# Patient Record
Sex: Male | Born: 2000
Health system: Southern US, Community
[De-identification: ages and names within clinical notes are randomized; demographics above are authoritative.]

## PROBLEM LIST (undated history)

## (undated) DIAGNOSIS — J45909 Unspecified asthma, uncomplicated: Secondary | ICD-10-CM

---
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
## 2021-04-26 ENCOUNTER — Encounter (HOSPITAL_COMMUNITY): Payer: Self-pay

## 2021-04-26 ENCOUNTER — Emergency Department (HOSPITAL_COMMUNITY): Payer: 59

## 2021-04-26 ENCOUNTER — Emergency Department (HOSPITAL_COMMUNITY)
Admission: EM | Admit: 2021-04-26 | Discharge: 2021-04-26 | Disposition: A | Discharge status: Home / Self Care | Payer: 59 | Attending: Emergency Medicine | Admitting: Emergency Medicine

## 2021-04-26 DIAGNOSIS — F17210 Nicotine dependence, cigarettes, uncomplicated: Secondary | ICD-10-CM | POA: Insufficient documentation

## 2021-04-26 DIAGNOSIS — S161XXA Strain of muscle, fascia and tendon at neck level, initial encounter: Secondary | ICD-10-CM

## 2021-04-26 DIAGNOSIS — I10 Essential (primary) hypertension: Secondary | ICD-10-CM | POA: Diagnosis not present

## 2021-04-26 DIAGNOSIS — J45909 Unspecified asthma, uncomplicated: Secondary | ICD-10-CM | POA: Diagnosis not present

## 2021-04-26 DIAGNOSIS — R42 Dizziness and giddiness: Secondary | ICD-10-CM | POA: Insufficient documentation

## 2021-04-26 DIAGNOSIS — F0781 Postconcussional syndrome: Secondary | ICD-10-CM

## 2021-04-26 DIAGNOSIS — W01198A Fall on same level from slipping, tripping and stumbling with subsequent striking against other object, initial encounter: Secondary | ICD-10-CM | POA: Diagnosis not present

## 2021-04-26 DIAGNOSIS — Y990 Civilian activity done for income or pay: Secondary | ICD-10-CM | POA: Insufficient documentation

## 2021-04-26 DIAGNOSIS — S199XXA Unspecified injury of neck, initial encounter: Secondary | ICD-10-CM | POA: Diagnosis present

## 2021-04-26 DIAGNOSIS — W19XXXA Unspecified fall, initial encounter: Secondary | ICD-10-CM

## 2021-04-26 NOTE — Discharge Instructions (Addendum)
I recommend a combination of tylenol and ibuprofen for management of your pain. You can take a low dose of both at the same time. I recommend 500 mg of Tylenol combined with 600 mg of ibuprofen. This is one maximum strength Tylenol and three regular ibuprofen. You can take these 2-3 times for day for your pain. Please try to take these medications with a small amount of food as well to prevent upsetting your stomach.  I have attached information on postconcussion syndrome.  Please reference this with any questions.  If you develop any new or worsening symptoms such as numbness, weakness, visual changes, nausea/vomiting you cannot control, worsening headaches, please come back to the emergency department for reevaluation.  It was a pleasure to meet you.

## 2021-04-26 NOTE — ED Triage Notes (Signed)
Pt arrived via walk in, states slip and fall at work. No LOC, no blood thinners. Vomited x1 after fall. No visual disturbances. Endorses headache, but no other issue.

## 2021-04-26 NOTE — ED Provider Notes (Addendum)
Emergency Medicine Provider Triage Evaluation Note  Edgar Crane , a 20 y.o. male  was evaluated in triage.  Pt complains of head injury. Slipped and fell at work, hit back of head on ground around 12 pm. Felt dizzy and unsteady on his feet afterward. Vomited once around 2 pm. Continues to feel nauseous. Denies anticoagulation.  Review of Systems  Positive: Head injury, neck pain, vomiting, nausea Negative: LOC, CP, SOB, neuro deficits  Physical Exam  BP (!) 158/89 (BP Location: Right Arm)   Pulse 87   Temp 98.5 F (36.9 C) (Oral)   Resp 16   SpO2 99%  Gen:   Awake, no distress   Resp:  Normal effort  MSK:   Moves extremities without difficulty, midline cervical spinal tenderness  Other:  Tenderness to occipital region  Medical Decision Making  Medically screening exam initiated at 2:46 PM.  Appropriate orders placed.  Edgar Crane was informed that the remainder of the evaluation will be completed by another provider, this initial triage assessment does not replace that evaluation, and the importance of remaining in the ED until their evaluation is complete.     Anselm Pancoast, PA-C 04/26/21 1449    Anselm Pancoast, PA-C 04/26/21 1450    Cathren Laine, MD 04/27/21 1700

## 2021-04-26 NOTE — ED Provider Notes (Signed)
Edgar Crane Provider Note   CSN: [id number] Arrival date & time: 04/26/21  1406     History Chief Complaint  Patient presents with   [name]    Edgar Crane is a 20 y.o. male.  HPI Patient is a 20 year old male with a medical history as noted below.  He presents to the emergency department due to a fall that occurred this morning at work.  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  He states that initially was having mild pain in the region and then went home and began feeling lightheaded and had an episode of nausea/vomiting.  He has mild nausea upon arrival to the emergency department which has since resolved.  He also states that his headache has resolved.  He reports mild neck pain.  Not anticoagulated.  No numbness, weakness, visual changes, chest pain, shortness of breath.    Past Medical History:  Diagnosis Date   Asthma     Patient Active Problem List   Diagnosis Date Noted   Morbid obesity (HCC) 01/07/2015   Acanthosis nigricans, acquired 01/07/2015   Dyspepsia 01/07/2015   Essential hypertension, benign 01/07/2015   Goiter 01/07/2015   Gynecomastia, male 01/07/2015    History reviewed. No pertinent surgical history.     Family History  Problem Relation Age of Onset   Diabetes Mother    Hyperlipidemia Mother    Stroke Maternal Grandfather     Social History   Tobacco Use   Smoking status: Some Days    Pack years: 0.00    Types: Cigarettes   Smokeless tobacco: Current    Home Medications Prior to Admission medications   Medication Sig Start Date End Date Taking? Authorizing Provider  atomoxetine (STRATTERA) 25 MG capsule Take 25 mg by mouth daily.      [provider]  ibuprofen (ADVIL,MOTRIN) 600 MG tablet Take 1 tablet (600 mg total) by mouth every 8 (eight) hours as needed. 03/31/18   Mardella Layman, MD  methylphenidate 36 MG  PO CR tablet Take 36 mg by mouth daily.    [provider]    Allergies    Patient has no known allergies.  Review of Systems   Review of Systems  Eyes:  Negative for visual disturbance.  Gastrointestinal:  Positive for nausea. Negative for vomiting.  Musculoskeletal:  Positive for myalgias and neck pain. Negative for back pain.  Skin:  Negative for wound.  Neurological:  Positive for headaches. Negative for syncope, weakness and numbness.   Physical Exam Updated Vital Signs BP (!) 148/91 (BP Location: Right Arm)   Pulse 78   Temp 98.5 F (36.9 C) (Oral)   Resp 14   SpO2 99%   Physical Exam Vitals and nursing note reviewed.  Constitutional:      General: He is not in acute distress.    Appearance: Normal appearance. He is normal weight. He is not ill-appearing, toxic-appearing or diaphoretic.  HENT:     Head: Normocephalic.     Comments: Mild tenderness noted along the occiput.  No crepitus.    Right Ear: External ear normal.     Left Ear: External ear normal.     Nose: Nose normal.     Mouth/Throat:     Mouth: Mucous membranes are moist.     Pharynx: Oropharynx is clear. No oropharyngeal exudate or posterior oropharyngeal erythema.  Eyes:  General: No scleral icterus.       Right eye: No discharge.        Left eye: No discharge.     Extraocular Movements: Extraocular movements intact.     Conjunctiva/sclera: Conjunctivae normal.     Pupils: Pupils are equal, round, and reactive to light.  Neck:     Comments: Mild diffuse tenderness noted along the cervical spine that appears to be worst along the bilateral cervical paraspinal musculature. Cardiovascular:     Rate and Rhythm: Normal rate and regular rhythm.     Pulses: Normal pulses.     Heart sounds: Normal heart sounds. No murmur heard.   No friction rub. No gallop.  Pulmonary:     Effort: Pulmonary effort is normal. No respiratory distress.     Breath sounds: Normal breath sounds. No stridor. No  wheezing, rhonchi or rales.  Abdominal:     General: Abdomen is flat.     Palpations: Abdomen is soft.     Tenderness: There is no abdominal tenderness.  Musculoskeletal:        General: Normal range of motion.     Cervical back: Normal range of motion and neck supple. Tenderness present.     Right lower leg: No edema.     Left lower leg: No edema.  Skin:    General: Skin is warm and dry.  Neurological:     General: No focal deficit present.     Mental Status: He is alert and oriented to person, place, and time.     Comments: Patient is oriented to person, place, and time. Patient phonates in clear, complete, and coherent sentences. Negative arm drift. Strength is 5/5 in all four extremities. Distal sensation intact in all four extremities. Ambulatory with a steady gait.    Psychiatric:        Mood and Affect: Mood normal.        Behavior: Behavior normal.    ED Results / Procedures / Treatments   Labs (all labs ordered are listed, but only abnormal results are displayed) Labs Reviewed - No data to display  EKG None  Radiology CT Head Wo Contrast  Result Date: 04/26/2021 CLINICAL DATA:  Neck trauma, status post fall EXAM: CT HEAD WITHOUT CONTRAST CT CERVICAL SPINE WITHOUT CONTRAST TECHNIQUE: Multidetector CT imaging of the head and cervical spine was performed following the standard protocol without intravenous contrast. Multiplanar CT image reconstructions of the cervical spine were also generated. COMPARISON:  None. FINDINGS: CT HEAD FINDINGS Brain: No evidence of acute infarction, hemorrhage, hydrocephalus, extra-axial collection or mass lesion/mass effect. Vascular: No hyperdense vessel or unexpected calcification. Skull: No osseous abnormality. Sinuses/Orbits: Visualized paranasal sinuses are clear. Visualized mastoid sinuses are clear. Visualized orbits demonstrate no focal abnormality. Other: No fluid collection or hematoma. CT CERVICAL SPINE FINDINGS Alignment: Normal. Skull  base and vertebrae: No acute fracture. No primary bone lesion or focal pathologic process. Soft tissues and spinal canal: No prevertebral fluid or swelling. No visible canal hematoma. Disc levels:  Disc spaces are maintained. No foraminal narrowing. Upper chest: Lung apices are clear. Other: No fluid collection or hematoma. IMPRESSION: 1. No acute intracranial pathology. 2. No acute osseous injury the cervical spine. Electronically Signed   By: Elige Ko   On: 04/26/2021 16:18   CT Cervical Spine Wo Contrast  Result Date: 04/26/2021 CLINICAL DATA:  Neck trauma, status post fall EXAM: CT HEAD WITHOUT CONTRAST CT CERVICAL SPINE WITHOUT CONTRAST TECHNIQUE: Multidetector CT imaging of the head and  cervical spine was performed following the standard protocol without intravenous contrast. Multiplanar CT image reconstructions of the cervical spine were also generated. COMPARISON:  None. FINDINGS: CT HEAD FINDINGS Brain: No evidence of acute infarction, hemorrhage, hydrocephalus, extra-axial collection or mass lesion/mass effect. Vascular: No hyperdense vessel or unexpected calcification. Skull: No osseous abnormality. Sinuses/Orbits: Visualized paranasal sinuses are clear. Visualized mastoid sinuses are clear. Visualized orbits demonstrate no focal abnormality. Other: No fluid collection or hematoma. CT CERVICAL SPINE FINDINGS Alignment: Normal. Skull base and vertebrae: No acute fracture. No primary bone lesion or focal pathologic process. Soft tissues and spinal canal: No prevertebral fluid or swelling. No visible canal hematoma. Disc levels:  Disc spaces are maintained. No foraminal narrowing. Upper chest: Lung apices are clear. Other: No fluid collection or hematoma. IMPRESSION: 1. No acute intracranial pathology. 2. No acute osseous injury the cervical spine. Electronically Signed   By: Elige Ko   On: 04/26/2021 16:18    Procedures Procedures   Medications Ordered in ED Medications - No data to  display  ED Course  I have reviewed the triage vital signs and the nursing notes.  Pertinent labs & imaging results that were available during my care of the patient were reviewed by me and considered in my medical decision making (see chart for details).    MDM Rules/Calculators/A&P                          Patient is a 20 year old male who presents to the emergency department with symptoms consistent with postconcussive syndrome.  Physical exam is extremely reassuring.  He has mild tenderness along the cervical spinal musculature as well as mild tenderness along the occiput but otherwise no complaints.  No midline thoracic or lumbar spine pain.  Neurological exam is benign.  Moving all 4 extremities with ease.  No gross deficits.  Ambulatory with a steady gait.  States his symptoms have mostly resolved since arriving to the emergency department.  Feel the patient is stable for discharge at this time and he is agreeable.  Urged him to continue monitoring his symptoms closely.  Return to the emergency department if he develops any new or worsening symptoms.  We discussed pain management with Tylenol and ibuprofen.  We discussed dosing.  His questions were answered and he was amicable at the time of discharge.  Final Clinical Impression(s) / ED Diagnoses Final diagnoses:  Fall, initial encounter  Strain of neck muscle, initial encounter  Postconcussive syndrome   Rx / DC Orders ED Discharge Orders     None        Placido Sou, PA-C 04/26/21 2131    Cathren Laine, MD 04/27/21 1700

## 2021-07-25 ENCOUNTER — Encounter (HOSPITAL_COMMUNITY): Payer: Self-pay

## 2021-08-23 ENCOUNTER — Encounter (HOSPITAL_COMMUNITY): Payer: Self-pay | Admitting: *Deleted

## 2021-08-23 ENCOUNTER — Emergency Department (HOSPITAL_COMMUNITY)
Admission: EM | Admit: 2021-08-23 | Discharge: 2021-08-23 | Disposition: A | Discharge status: Home / Self Care | Payer: 59 | Attending: Emergency Medicine | Admitting: Emergency Medicine

## 2021-08-23 ENCOUNTER — Encounter (INDEPENDENT_AMBULATORY_CARE_PROVIDER_SITE_OTHER): Payer: Self-pay

## 2021-08-23 ENCOUNTER — Other Ambulatory Visit: Payer: Self-pay

## 2021-08-23 DIAGNOSIS — F17210 Nicotine dependence, cigarettes, uncomplicated: Secondary | ICD-10-CM | POA: Insufficient documentation

## 2021-08-23 DIAGNOSIS — J45909 Unspecified asthma, uncomplicated: Secondary | ICD-10-CM | POA: Insufficient documentation

## 2021-08-23 DIAGNOSIS — R55 Syncope and collapse: Secondary | ICD-10-CM | POA: Insufficient documentation

## 2021-08-23 DIAGNOSIS — I10 Essential (primary) hypertension: Secondary | ICD-10-CM | POA: Insufficient documentation

## 2021-08-23 DIAGNOSIS — R42 Dizziness and giddiness: Secondary | ICD-10-CM | POA: Diagnosis present

## 2021-08-23 LAB — COMPREHENSIVE METABOLIC PANEL
ALT: 23 U/L (ref 0–44)
AST: 20 U/L (ref 15–41)
Albumin: 3.7 g/dL (ref 3.5–5.0)
Alkaline Phosphatase: 63 U/L (ref 38–126)
Anion gap: 9 (ref 5–15)
BUN: 11 mg/dL (ref 6–20)
CO2: 28 mmol/L (ref 22–32)
Calcium: 9.4 mg/dL (ref 8.9–10.3)
Chloride: 99 mmol/L (ref 98–111)
Creatinine, Ser: 0.95 mg/dL (ref 0.61–1.24)
GFR, Estimated: >60 mL/min (ref >60)
Glucose, Bld: 104 mg/dL — ABNORMAL HIGH (ref 70–99)
Potassium: 4 mmol/L (ref 3.5–5.1)
Sodium: 136 mmol/L (ref 135–145)
Total Bilirubin: 0.9 mg/dL (ref 0.3–1.2)
Total Protein: 7.1 g/dL (ref 6.5–8.1)

## 2021-08-23 LAB — CBC
HCT: 43.7 % (ref 39.0–52.0)
Hemoglobin: 15.6 g/dL (ref 13.0–17.0)
MCH: 33.9 pg (ref 26.0–34.0)
MCHC: 35.7 g/dL (ref 30.0–36.0)
MCV: 95 fL (ref 80.0–100.0)
Platelets: 226 10*3/uL (ref 150–400)
RBC: 4.6 MIL/uL (ref 4.22–5.81)
RDW: 12.1 % (ref 11.5–15.5)
WBC: 8.4 10*3/uL (ref 4.0–10.5)
nRBC: 0 % (ref 0.0–0.2)

## 2021-08-23 LAB — URINALYSIS, ROUTINE W REFLEX MICROSCOPIC
Bilirubin Urine: NEGATIVE
Glucose, UA: NEGATIVE mg/dL
Hgb urine dipstick: NEGATIVE
Ketones, ur: NEGATIVE mg/dL
Leukocytes,Ua: NEGATIVE
Nitrite: NEGATIVE
Protein, ur: NEGATIVE mg/dL
Specific Gravity, Urine: 1.019 (ref 1.005–1.030)
pH: 5 (ref 5.0–8.0)

## 2021-08-23 NOTE — ED Provider Notes (Signed)
I saw and evaluated the patient, reviewed the resident's note and I agree with the findings and plan.  EKG Interpretation  Date/Time:  Wednesday August 23 2021 04:08:17 EDT Ventricular Rate:  85 PR Interval:  168 QRS Duration: 92 QT Interval:  352 QTC Calculation: 418 R Axis:   66 Text Interpretation: Normal sinus rhythm with sinus arrhythmia Normal ECG Confirmed by Lorre Nick ([id number]) on 08/23/2021 11:86:65 AM   20 year old male who presents with near syncope after urinating.  Patient's work-up here has been negative.  Has a suprapubic tenderness.  Urinalysis negative for that.  EKG without any changes.  Plan will be to discharge   Lorre Nick, MD 08/23/21 1206

## 2021-08-23 NOTE — ED Provider Notes (Signed)
[facility] EMERGENCY DEPARTMENT Provider Note   CSN: [id number] Arrival date & time: 08/23/21  3154     History Chief Complaint  Patient presents with   Dizziness    Edgar Crane is a 20 y.o. male who presents after experiencing an episode of presyncope overnight.  Patient states that he had gotten up to urinate in the night and as he was nearing the end of his urination he felt and as if things were swaying.  He is unable to quantify the amount of time that he was up before the onset of this episode.  He did not have a fainting episode.  He says that he has not had any episode like this previously.  He reports that he has been adequately eating and drinking.  He does report some suprapubic discomfort.  Mom who was in the room states that he recently moved out of his parents home and she states that he has had increased anxiety in this time.    Past Medical History:  Diagnosis Date   Asthma     Patient Active Problem List   Diagnosis Date Noted   Morbid obesity (HCC) 01/07/2015   Acanthosis nigricans, acquired 01/07/2015   Dyspepsia 01/07/2015   Essential hypertension, benign 01/07/2015   Goiter 01/07/2015   Gynecomastia, male 01/07/2015    History reviewed. No pertinent surgical history.     Family History  Problem Relation Age of Onset   Diabetes Mother    Hyperlipidemia Mother    Stroke Maternal Grandfather     Social History   Tobacco Use   Smoking status: Some Days    Types: Cigarettes   Smokeless tobacco: Current    Home Medications Prior to Admission medications   Medication Sig Start Date End Date Taking? Authorizing Provider  atomoxetine (STRATTERA) 25 MG capsule Take 25 mg by mouth daily.      [provider]  ibuprofen (ADVIL,MOTRIN) 600 MG tablet Take 1 tablet (600 mg total) by mouth every 8 (eight) hours as needed. 03/31/18   Mardella Layman, MD  methylphenidate 36 MG PO CR tablet Take 36 mg by mouth daily.     [provider]    Allergies    Patient has no known allergies.  Review of Systems   Review of Systems  Constitutional:  Negative for appetite change.  Respiratory:  Negative for chest tightness and shortness of breath.   Gastrointestinal:  Positive for abdominal pain (Suprapubic). Negative for abdominal distention.  Genitourinary:  Positive for difficulty urinating. Negative for dysuria, flank pain and urgency.  Musculoskeletal:  Negative for back pain.  Neurological:  Positive for dizziness, weakness and light-headedness. Negative for syncope and headaches.   Physical Exam Updated Vital Signs BP 113/65   Pulse (!) 58   Temp 98.6 F (37 C)   Resp 16   SpO2 100%   Constitutional: Well-appearing gentleman resting in bed.  No acute distress noted. Eyes: Negative for horizontal and vertical nystagmus. Neck: Negative for midline vertebral tenderness. Cardio: Regular rate and rhythm.  No murmurs, rubs, gallops.  Pulses are equal in all 4 extremities. Pulm: Clear to auscultation bilaterally.  Normal work of breathing on room air. Abdomen: Abdomen is soft, nondistended, tender to palpation over suprapubic area. GU: No CVA tenderness. MSK: Normal bulk and tone.  No evidence of lower extremity edema. Skin: Skin is warm and dry. Neuro: Mental Status: Patient is awake, alert, oriented x3 No signs of aphasia or neglect Cranial Nerves: II:  Pupils equal, round, and reactive to light.   III,IV, VI: EOMI without ptosis or diploplia.  V: Facial sensation is symmetric to light touch and  Temperature. VII: Facial movement is symmetric.  VIII: hearing is intact to voice X: Uvula elevates symmetrically XI: Shoulder shrug is symmetric. XII: tongue is midline without atrophy or fasciculations.  Motor: Normal effort thorughout, at Least 5/5 bilateral UE, 5/5 bilateral lower extremitiy  Sensory: Sensation is grossly intact and equal bilateral UEs & LEs Cerebellar: Finger-Nose and  Heel-Shin intact bilaterally Psych: Normal mood and affect.   ED Results / Procedures / Treatments   Labs (all labs ordered are listed, but only abnormal results are displayed) Labs Reviewed  COMPREHENSIVE METABOLIC PANEL - Abnormal; Notable for the following components:      Result Value   Glucose, Bld 104 (*)    All other components within normal limits  CBC  URINALYSIS, ROUTINE W REFLEX MICROSCOPIC    EKG EKG Interpretation  Date/Time:  Wednesday August 23 2021 04:08:17 EDT Ventricular Rate:  85 PR Interval:  168 QRS Duration: 92 QT Interval:  352 QTC Calculation: 418 R Axis:   66 Text Interpretation: Normal sinus rhythm with sinus arrhythmia Normal ECG Confirmed by Lorre Nick ([id number]) on 08/23/2021 11:33:59 AM  Radiology No results found.  Procedures None  Medications Ordered in ED Medications - No data to display  ED Course  I have reviewed the triage vital signs and the nursing notes.  Pertinent labs & imaging results that were available during my care of the patient were reviewed by me and considered in my medical decision making (see chart for details).    MDM Rules/Calculators/A&P                           Patient presents after single episode of near syncope at the end of micturition overnight.  He denies previous episodes like this.  He describes a sensation as a swaying and has subjective weakness.  Fortunately EKG showed normal sinus rhythm.  Urinalysis was negative for infection and remainder of labs were within normal limits.  Orthostatic vital signs were negative.  Given patient's presentation and negative work-up I feel this to be vasovagal in nature.  Patient has been advised to follow-up with primary care if he continues have episodes such as this one.  He is also been advised to return for reevaluation if he has a true syncopal episode.  Final Clinical Impression(s) / ED Diagnoses Final diagnoses:  Vasovagal attack    Rx / DC Orders ED  Discharge Orders     None        Champ Mungo, DO 08/23/21 1322    Lorre Nick, MD 08/25/21 [phone]

## 2021-08-23 NOTE — ED Triage Notes (Signed)
Pt reports not feeling well after urinating tonight. Reports chest discomfort and lightheadedness.

## 2021-08-23 NOTE — ED Provider Notes (Signed)
Emergency Medicine Provider Triage Evaluation Note  Edgar Crane , a 20 y.o. male  was evaluated in triage.  Pt complains of near syncope.  The patient reports that he awoke to urinate earlier tonight.  Reports that he was feeling at his baseline until after he finished urinating when he suddenly felt lightheaded as if he might pass out.  He is also developed some aching pain in his suprapubic region.  He does feel that over the last few days that he has had some urinary hesitancy.  No dysuria, hematuria, flank pain, penile or testicular pain or swelling, penile discharge, diarrhea, constipation, nausea, vomiting, chest pain shortness of breath, cough, or URI symptoms.  He was feeling at his baseline earlier today.  He denies polyuria or polydipsia.  Reports that he has been eating and drinking at his baseline.  He has had normal urine output.  He has no concern for STIs.  Review of Systems  Positive: Lightheadedness, near syncope, suprapubic pain, urinary hesitancy Negative: Fever, chills, penile or testicular pain or swelling, diarrhea, nausea, vomiting, flank pain, back pain, chest pain, shortness of breath, URI symptoms  Physical Exam  BP 120/85 (BP Location: Left Arm)   Pulse 77   Temp 98.6 F (37 C)   Resp 18   SpO2 100%  Gen:   Awake, no distress   Resp:  Normal effort  MSK:   Moves extremities without difficulty  Other:  Minimal tenderness to palpation in the suprapubic region.  No peritoneal signs.  Medical Decision Making  Medically screening exam initiated at 4:17 AM.  Appropriate orders placed.  Edgar Crane was informed that the remainder of the evaluation will be completed by another provider, this initial triage assessment does not replace that evaluation, and the importance of remaining in the ED until their evaluation is complete.  Will order labs and urinalysis.  Vital signs are stable.  He will require further work-up and evaluation in the emergency department.    Frederik Pear A, PA-C 08/23/21 0417    Gilda Crease, MD 08/23/21 [phone]

## 2021-08-23 NOTE — ED Notes (Signed)
Got patient into a gown on the monitor patient is resting with family at bedside and call bell in reach 

## 2021-08-23 NOTE — Discharge Instructions (Addendum)
You were evaluated today after a near syncopal episode overnight.  Fortunately, your labs are normal and the EKG that we obtained which tells [place] about the electrical activity of your heart was also normal.  This seems to be what is termed vasovagal in nature which is a transient neural reflex that affects your blood pressure and heart rate and can give you the sensation that you are about to faint.  I recommend that you return for reevaluation if you experience a syncopal episode.  If you continue to have episodes like this, I recommend that you follow-up with your primary care provider.

## 2021-12-04 ENCOUNTER — Encounter (HOSPITAL_COMMUNITY): Payer: Self-pay

## 2021-12-04 ENCOUNTER — Emergency Department (HOSPITAL_COMMUNITY): Payer: 59

## 2021-12-04 ENCOUNTER — Emergency Department (HOSPITAL_COMMUNITY)
Admission: EM | Admit: 2021-12-04 | Discharge: 2021-12-04 | Disposition: A | Discharge status: Home / Self Care | Payer: 59 | Source: Home / Self Care | Attending: Emergency Medicine | Admitting: Emergency Medicine

## 2021-12-04 ENCOUNTER — Emergency Department (HOSPITAL_COMMUNITY)
Admission: EM | Admit: 2021-12-04 | Discharge: 2021-12-04 | Disposition: A | Discharge status: Home / Self Care | Payer: 59 | Attending: Emergency Medicine | Admitting: Emergency Medicine

## 2021-12-04 ENCOUNTER — Other Ambulatory Visit: Payer: Self-pay

## 2021-12-04 DIAGNOSIS — R109 Unspecified abdominal pain: Secondary | ICD-10-CM

## 2021-12-04 DIAGNOSIS — R002 Palpitations: Secondary | ICD-10-CM | POA: Diagnosis not present

## 2021-12-04 DIAGNOSIS — R102 Pelvic and perineal pain: Secondary | ICD-10-CM | POA: Insufficient documentation

## 2021-12-04 DIAGNOSIS — M545 Low back pain, unspecified: Secondary | ICD-10-CM | POA: Insufficient documentation

## 2021-12-04 DIAGNOSIS — R0789 Other chest pain: Secondary | ICD-10-CM | POA: Insufficient documentation

## 2021-12-04 DIAGNOSIS — Z5321 Procedure and treatment not carried out due to patient leaving prior to being seen by health care provider: Secondary | ICD-10-CM | POA: Insufficient documentation

## 2021-12-04 DIAGNOSIS — R3982 Chronic bladder pain: Secondary | ICD-10-CM | POA: Insufficient documentation

## 2021-12-04 DIAGNOSIS — R300 Dysuria: Secondary | ICD-10-CM | POA: Insufficient documentation

## 2021-12-04 DIAGNOSIS — R1032 Left lower quadrant pain: Secondary | ICD-10-CM | POA: Diagnosis not present

## 2021-12-04 LAB — URINALYSIS, ROUTINE W REFLEX MICROSCOPIC
Bilirubin Urine: NEGATIVE
Glucose, UA: NEGATIVE mg/dL
Hgb urine dipstick: NEGATIVE
Ketones, ur: 20 mg/dL — AB
Leukocytes,Ua: NEGATIVE
Nitrite: NEGATIVE
Protein, ur: NEGATIVE mg/dL
Specific Gravity, Urine: 1.027 (ref 1.005–1.030)
pH: 5 (ref 5.0–8.0)

## 2021-12-04 LAB — TROPONIN I (HIGH SENSITIVITY): Troponin I (High Sensitivity): 4 ng/L (ref <18)

## 2021-12-04 LAB — COMPREHENSIVE METABOLIC PANEL
ALT: 25 U/L (ref 0–44)
AST: 28 U/L (ref 15–41)
Albumin: 3.9 g/dL (ref 3.5–5.0)
Alkaline Phosphatase: 54 U/L (ref 38–126)
Anion gap: 7 (ref 5–15)
BUN: 9 mg/dL (ref 6–20)
CO2: 25 mmol/L (ref 22–32)
Calcium: 8.7 mg/dL — ABNORMAL LOW (ref 8.9–10.3)
Chloride: 105 mmol/L (ref 98–111)
Creatinine, Ser: 0.94 mg/dL (ref 0.61–1.24)
GFR, Estimated: >60 mL/min (ref >60)
Glucose, Bld: 108 mg/dL — ABNORMAL HIGH (ref 70–99)
Potassium: 3.6 mmol/L (ref 3.5–5.1)
Sodium: 137 mmol/L (ref 135–145)
Total Bilirubin: 1.3 mg/dL — ABNORMAL HIGH (ref 0.3–1.2)
Total Protein: 7 g/dL (ref 6.5–8.1)

## 2021-12-04 LAB — CBC
HCT: 43.8 % (ref 39.0–52.0)
Hemoglobin: 15.3 g/dL (ref 13.0–17.0)
MCH: 33.7 pg (ref 26.0–34.0)
MCHC: 34.9 g/dL (ref 30.0–36.0)
MCV: 96.5 fL (ref 80.0–100.0)
Platelets: 230 10*3/uL (ref 150–400)
RBC: 4.54 MIL/uL (ref 4.22–5.81)
RDW: 12.2 % (ref 11.5–15.5)
WBC: 8.6 10*3/uL (ref 4.0–10.5)
nRBC: 0 % (ref 0.0–0.2)

## 2021-12-04 LAB — LIPASE, BLOOD: Lipase: 23 U/L (ref 11–51)

## 2021-12-04 NOTE — ED Notes (Signed)
X1 vitals with no response °

## 2021-12-04 NOTE — ED Triage Notes (Signed)
Pt reports bladder and low back pain for about a week. Pt states that he feels like he is not emptying his bladder fully when urinating. Pt reports he went to [facility] ED last night but left due to wait times.

## 2021-12-04 NOTE — ED Notes (Signed)
Pt had painful urination, unable to collect sample. Will try again.

## 2021-12-04 NOTE — ED Provider Notes (Signed)
[facility] DEPT Provider Note   CSN: [id number] Arrival date & time: 12/04/21  1623     History  Chief Complaint  Patient presents with   Urinary Retention    Edgar Crane is a 21 y.o. male.  Patient presents with chief complaint suprapubic and left lower quadrant pain ongoing for about 1 week.  He states he has no problems urinating.  However after he urinates he feels like his bladder is not fully empty.  Also notices pain in his lower abdomen for the past week and presents to the ER.  No reports of fevers or cough no vomiting or diarrhea no back pain or other regional pain.      Home Medications Prior to Admission medications   Medication Sig Start Date End Date Taking? Authorizing Provider  atomoxetine (STRATTERA) 25 MG capsule Take 25 mg by mouth daily.      [provider]  ibuprofen (ADVIL,MOTRIN) 600 MG tablet Take 1 tablet (600 mg total) by mouth every 8 (eight) hours as needed. 03/31/18   Vanessa Kick, MD  methylphenidate 36 MG PO CR tablet Take 36 mg by mouth daily.    [provider]      Allergies    Patient has no known allergies.    Review of Systems   Review of Systems  Constitutional:  Negative for fever.  HENT:  Negative for ear pain and sore throat.   Eyes:  Negative for pain.  Respiratory:  Negative for cough.   Cardiovascular:  Negative for chest pain.  Gastrointestinal:  Positive for abdominal pain.  Genitourinary:  Negative for flank pain.  Musculoskeletal:  Negative for back pain.  Skin:  Negative for color change and rash.  Neurological:  Negative for syncope.  All other systems reviewed and are negative.  Physical Exam Updated Vital Signs BP 138/84 (BP Location: Right Arm)    Pulse 60    Temp 98.4 F (36.9 C) (Oral)    Resp 20    Ht 6\' 2"  (1.88 m)    Wt 104.3 kg    SpO2 98%    BMI 29.53 kg/m  Physical Exam Constitutional:      Appearance: He is well-developed.  HENT:     Head:  Normocephalic.     Nose: Nose normal.  Eyes:     Extraocular Movements: Extraocular movements intact.  Cardiovascular:     Rate and Rhythm: Normal rate.  Pulmonary:     Effort: Pulmonary effort is normal.  Abdominal:     Tenderness: There is no guarding or rebound.     Comments: Mild tenderness in the suprapubic/left lower quadrant region.  Skin:    Coloration: Skin is not jaundiced.  Neurological:     Mental Status: He is alert. Mental status is at baseline.    ED Results / Procedures / Treatments   Labs (all labs ordered are listed, but only abnormal results are displayed) Labs Reviewed  URINALYSIS, ROUTINE W REFLEX MICROSCOPIC - Abnormal; Notable for the following components:      Result Value   Ketones, ur 20 (*)    All other components within normal limits    EKG None  Radiology CT Abdomen Pelvis Wo Contrast  Result Date: 12/04/2021 CLINICAL DATA:  Left lower quadrant pain and bladder pain EXAM: CT ABDOMEN AND PELVIS WITHOUT CONTRAST TECHNIQUE: Multidetector CT imaging of the abdomen and pelvis was performed following the standard protocol without IV contrast. RADIATION DOSE REDUCTION: This exam was performed according  to the departmental dose-optimization program which includes automated exposure control, adjustment of the mA and/or kV according to patient size and/or use of iterative reconstruction technique. COMPARISON:  None. FINDINGS: Lower chest: Included lung bases are clear. Heart size is normal. Bilateral gynecomastia. Hepatobiliary: Unremarkable unenhanced appearance of the liver. No focal liver lesion identified. Gallbladder within normal limits. No hyperdense gallstone. No biliary dilatation. Pancreas: Unremarkable. No pancreatic ductal dilatation or surrounding inflammatory changes. Spleen: Normal in size without focal abnormality. Adrenals/Urinary Tract: Adrenal glands are unremarkable. Kidneys are normal, without renal calculi, focal lesion, or hydronephrosis.  Bladder is unremarkable for the degree of distension. Stomach/Bowel: Stomach is within normal limits. Appendix appears normal (series 2, image 68). No evidence of bowel wall thickening, distention, or inflammatory changes. Vascular/Lymphatic: No significant vascular findings are present. No enlarged abdominal or pelvic lymph nodes. Reproductive: Prostate is unremarkable. Other: No free fluid. No abdominopelvic fluid collection. No pneumoperitoneum. No abdominal wall hernia. Musculoskeletal: No acute or significant osseous findings. IMPRESSION: No acute abdominopelvic findings. Electronically Signed   By: Davina Poke D.O.   On: 12/04/2021 19:03   DG Chest 2 View  Result Date: 12/04/2021 CLINICAL DATA:  Chest pain. EXAM: CHEST - 2 VIEW COMPARISON:  None. FINDINGS: The heart size and mediastinal contours are within normal limits. No consolidation, effusion, or pneumothorax. There is a round opacity at the left lung base measuring 3.2 cm, which may be external to the patient. No acute osseous abnormality. IMPRESSION: 1. No acute cardiopulmonary process. 2. Round opacity measuring 3.2 cm at the left lung base, may be external to the patient. Clinical correlation is recommended and repeat evaluation may be obtained if clinically warranted. Electronically Signed   By: Brett Fairy M.D.   On: 12/04/2021 01:14    Procedures Procedures    Medications Ordered in ED Medications - No data to display  ED Course/ Medical Decision Making/ A&P                           Medical Decision Making Amount and/or Complexity of Data Reviewed Radiology: ordered.   Bladder scan is normal showed no retained urine.  Urinalysis shows no signs of infection.  CT of the pelvis shows no other acute pathology noted.  Will recommend outpatient follow-up with his doctor within the week.  Recommend he return if he has fevers worsening symptoms or any additional concerns.        Final Clinical Impression(s) / ED  Diagnoses Final diagnoses:  Abdominal pain, unspecified abdominal location    Rx / DC Orders ED Discharge Orders     None         Luna Fuse, MD 12/04/21 2030

## 2021-12-04 NOTE — ED Notes (Signed)
X2 no response °

## 2021-12-04 NOTE — Discharge Instructions (Signed)
Call your primary care doctor or specialist as discussed in the next 2-3 days.   Return immediately back to the ER if:  Your symptoms worsen within the next 12-24 hours. You develop new symptoms such as new fevers, persistent vomiting, new pain, shortness of breath, or new weakness or numbness, or if you have any other concerns.  

## 2021-12-04 NOTE — ED Triage Notes (Signed)
Pt arrives POV for eval of "bladder pain" x weeks. Pt reports suprapubic and back pain x 2-3 weeks. States worse tonight. Also reports about 30 mins PTA started feeling palpitations and heart racing w/ associated chest pain. Pt denies hx of same, denies dysuria.

## 2021-12-04 NOTE — ED Provider Triage Note (Signed)
Emergency Medicine Provider Triage Evaluation Note  Rodrigo Mcgranahan , a 21 y.o. male  was evaluated in triage.  Pt complains of multiple complaints, originally came in with bladder pain going on x4 months, states he has dysuria when peeing and will have decreased urination, also notes he has some lower back pain pains in the back no flank pain no history of kidney stones.  Also history of chest pain started while he was in triage pains in the middle of his chest goes up into his shoulder no cardiac history.  Review of Systems  Positive: Chest pain, bladder pain Negative: Shortness of breath, peripheral edema  Physical Exam  BP (!) 150/82 (BP Location: Right Arm)    Pulse 92    Temp 98.4 F (36.9 C) (Oral)    Resp 16    Ht 5\' 10"  (1.778 m)    Wt 102 kg    SpO2 100%    BMI 32.27 kg/m  Gen:   Awake, no distress   Resp:  Normal effort  MSK:   Moves extremities without difficulty  Other:    Medical Decision Making  Medically screening exam initiated at 12:53 AM.  Appropriate orders placed.  Zackari Ruane was informed that the remainder of the evaluation will be completed by another provider, this initial triage assessment does not replace that evaluation, and the importance of remaining in the ED until their evaluation is complete.  Presents with multiple complaints labwork and treatment ordered will need further work-up.   Anne Hahn, PA-C 12/04/21 [phone]

## 2021-12-04 NOTE — ED Provider Triage Note (Signed)
Emergency Medicine Provider Triage Evaluation Note  Edgar Crane , a 21 y.o. male  was evaluated in triage.  Pt complains of difficulty urinating. States that this has been going on for 2 weeks. He states that he is unable to initiate a full stream, only urinating drops at a time. Denies dysuria, hematuria, abdominal pain, fevers, chills. Was seen last night but LWBS due to wait times  Review of Systems  Positive:  Negative: See above  Physical Exam  BP 138/84 (BP Location: Left Arm)    Pulse 68    Temp 98.4 F (36.9 C) (Oral)    Resp 16    Ht 6\' 2"  (1.88 m)    Wt 104.3 kg    SpO2 100%    BMI 29.53 kg/m  Gen:   Awake, no distress   Resp:  Normal effort  MSK:   Moves extremities without difficulty  Other:    Medical Decision Making  Medically screening exam initiated at 4:53 PM.  Appropriate orders placed.  Harvard Quattrochi was informed that the remainder of the evaluation will be completed by another provider, this initial triage assessment does not replace that evaluation, and the importance of remaining in the ED until their evaluation is complete.     Bud Face, PA-C 12/04/21 1655

## 2021-12-12 ENCOUNTER — Encounter (HOSPITAL_COMMUNITY): Payer: Self-pay

## 2021-12-12 ENCOUNTER — Emergency Department (HOSPITAL_COMMUNITY)
Admission: EM | Admit: 2021-12-12 | Discharge: 2021-12-12 | Disposition: A | Discharge status: Home / Self Care | Payer: 59 | Attending: Emergency Medicine | Admitting: Emergency Medicine

## 2021-12-12 DIAGNOSIS — R1032 Left lower quadrant pain: Secondary | ICD-10-CM | POA: Insufficient documentation

## 2021-12-12 DIAGNOSIS — R3989 Other symptoms and signs involving the genitourinary system: Secondary | ICD-10-CM | POA: Diagnosis not present

## 2021-12-12 NOTE — Discharge Instructions (Addendum)
You may take 600 mg ibuprofen every 6 hours as needed for pain.  You may use a warm compress to the affected areas for up to 15 minutes at a time.  She will place a barrier to your skin in the affected areas.  Do not take Azo more than 3 days in a row.  Call your primary care provider today to set up a follow-up appointment regarding today's ED visit.  Return to the ED for symptoms increasing or worsening abdominal pain, urinary symptoms, or worsening symptoms.

## 2021-12-12 NOTE — ED Triage Notes (Signed)
Pt presents with c/o bladder pain. Pt reports that he was diagnosed with a UTI last week, still reporting pain in his bladder.

## 2021-12-12 NOTE — ED Provider Notes (Signed)
[facility] DEPT Provider Note   CSN: [id number] Arrival date & time: 12/12/21  1215     History  Chief Complaint  Patient presents with   Bladder Pain    Edgar Crane is a 21 y.o. male who presents to the ED complaining of bladder pain onset 1 week.  He notes that his symptoms are unchanged from when he was evaluated in the ED on 12/04/2021.  Has associated suprapubic abdominal pain.   Has tried AZO and ibuprofen with no relief of her symptoms.  Denies fever, chills, nausea, vomiting, change in urinary stream, dysuria, hematuria, penile pain/swelling, testicular pain/swelling. Denies concern for STD at this time.  Patient notes he is not sexually active.  Per patient chart review: Patient was evaluated in the ED on 12/04/2021 for similar concerns.  At that time he had a negative work-up including negative CT abdomen pelvis without contrast, negative urinalysis.  Patient was discharged home and instructed to call [facility] physicians in 3 days for follow-up.   The history is provided by the patient. No language interpreter was used.      Home Medications Prior to Admission medications   Medication Sig Start Date End Date Taking? Authorizing Provider  atomoxetine (STRATTERA) 25 MG capsule Take 25 mg by mouth daily.      [provider]  ibuprofen (ADVIL,MOTRIN) 600 MG tablet Take 1 tablet (600 mg total) by mouth every 8 (eight) hours as needed. 03/31/18   Vanessa Kick, MD  methylphenidate 36 MG PO CR tablet Take 36 mg by mouth daily.    [provider]      Allergies    Patient has no known allergies.    Review of Systems   Review of Systems  Constitutional:  Negative for chills and fever.  Gastrointestinal:  Positive for abdominal pain. Negative for nausea and vomiting.  Genitourinary:  Negative for difficulty urinating, dysuria, hematuria, penile discharge, penile pain, penile swelling, scrotal swelling and testicular pain.  All  other systems reviewed and are negative.  Physical Exam Updated Vital Signs BP (!) 141/95 (BP Location: Left Arm)    Pulse 75    Temp 98.8 F (37.1 C) (Oral)    Resp 16    Ht 6\' 2"  (1.88 m)    Wt 97.7 kg    SpO2 100%    BMI 27.64 kg/m  Physical Exam Vitals and nursing note reviewed.  Constitutional:      General: He is not in acute distress.    Appearance: He is not diaphoretic.  HENT:     Head: Normocephalic and atraumatic.     Mouth/Throat:     Pharynx: No oropharyngeal exudate.  Eyes:     General: No scleral icterus.    Conjunctiva/sclera: Conjunctivae normal.  Cardiovascular:     Rate and Rhythm: Normal rate and regular rhythm.     Pulses: Normal pulses.     Heart sounds: Normal heart sounds.  Pulmonary:     Effort: Pulmonary effort is normal. No respiratory distress.     Breath sounds: Normal breath sounds. No wheezing.  Abdominal:     General: Bowel sounds are normal.     Palpations: Abdomen is soft. There is no mass.     Tenderness: There is abdominal tenderness in the suprapubic area and left lower quadrant. There is no guarding or rebound.     Comments: Mild tenderness to palpation noted to suprapubic and left lower quadrant region.  Negative CVA tenderness noted bilaterally.  Musculoskeletal:        General: Normal range of motion.     Cervical back: Normal range of motion and neck supple.  Skin:    General: Skin is warm and dry.  Neurological:     Mental Status: He is alert.  Psychiatric:        Behavior: Behavior normal.    ED Results / Procedures / Treatments   Labs (all labs ordered are listed, but only abnormal results are displayed) Labs Reviewed - No data to display  EKG None  Radiology No results found.  Procedures Procedures    Medications Ordered in ED Medications - No data to display  ED Course/ Medical Decision Making/ A&P Clinical Course as of 12/12/21 1428  Tue Dec 12, 2021  1410 Case discussed with attending who agrees with  discharge treatment plan to follow-up with primary care provider. [SB]  1412 Re-evaluated and noted improvement of symptoms with treatment regimen. Discussed discharge treatment plan. Pt agreeable at this time. Pt appears safe for discharge. [SB]  1422 Discussed with patient at bedside at length regarding due to no new symptoms and just continued symptoms from previous ED visit on 12/04/2021 that he will be discharged home and instructed on primary care follow-up.  Patient agreeable at this time. [SB]    Clinical Course User Index [SB] Jamala Kohen A, PA-C                           Medical Decision Making  Patient presents to the ED with complaints for bladder pain x1 week.  No history of BPH or urinary concerns.  Patient does not have a urologist.  Vital signs stable, patient afebrile, not tachycardic or hypoxic.  On exam patient with mild suprapubic and left lower quadrant tenderness to palpation.  No CVA tenderness noted bilaterally.  Differential diagnosis includes acute cystitis, pyelonephritis, nephrolithiasis.  Patient was evaluated in the ED on 12/04/2021 for similar concerns, at that time had a negative bladder scan, negative urinalysis, negative CT abdomen pelvis without contrast.  Patient was discharged from the ED and instructed to follow-up with his primary care provider for further evaluation of his symptoms.  Doubt acute cystitis, pyelonephritis, nephrolithiasis at this time due to no change in patient's symptoms since previous negative work-up on 12/04/2021.  Detailed discussion in length with patient at bedside regarding due to no new symptoms since initial evaluation on 12/04/2021 and negative work-up with imaging and urinalysis.  Case discussed with attending who agrees, doubt need for repeat work-up today in the ED.  Patient agreeable at this time.  Discussed with patient to not take Azo for more than 3 days.  Discussed with the patient to follow-up with primary care provider for  further evaluation of symptoms.  Supportive care measures and strict return precautions discussed with patient at bedside.  Acknowledges and verbalized understanding.  Patient appears safe for discharge.  Follow-up as indicated discharge paperwork.`  Final Clinical Impression(s) / ED Diagnoses Final diagnoses:  Bladder pain    Rx / DC Orders ED Discharge Orders     None         Shy Guallpa A, PA-C 12/12/21 1430    Varney Biles, MD 12/13/21 [phone]

## 2022-05-31 IMAGING — CT CT CERVICAL SPINE W/O CM
4 series · 15 of 33 positions shown, 18 images · non-contrast
Comparison: None.

CLINICAL DATA: Neck trauma, status post fall

EXAM:
CT HEAD WITHOUT CONTRAST
CT CERVICAL SPINE WITHOUT CONTRAST
TECHNIQUE: Multidetector CT imaging of the head and cervical spine was
performed following the standard protocol without intravenous
contrast. Multiplanar CT image reconstructions of the cervical spine
were also generated.

[Series 4: c spine soft · axial · 0.37mm/px · z∈[+1107,+1143]mm · 2 of 106 slices shown]
[im 18/106  soft-tissue]
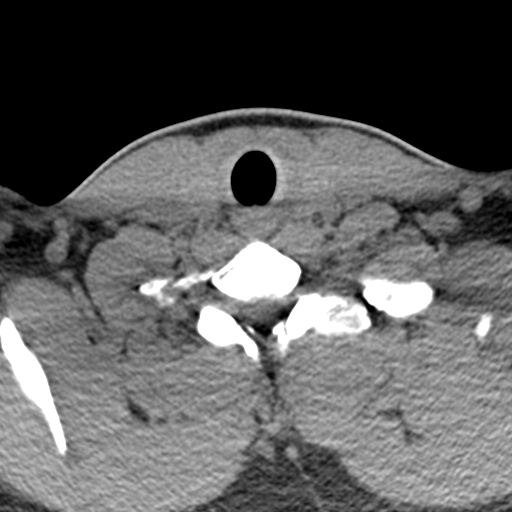
[im 36/106  soft-tissue]
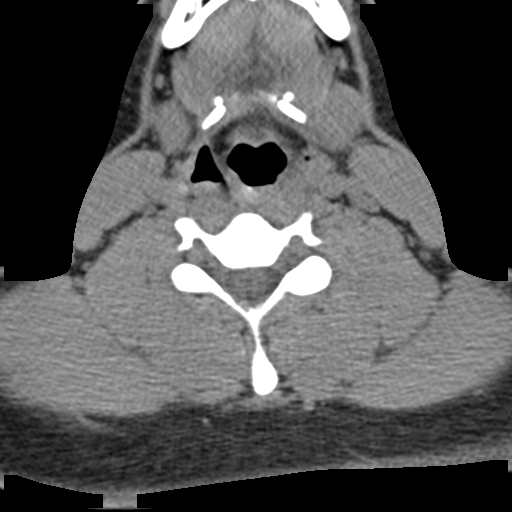

[Series 5: orthogonal bone · axial · 0.23mm/px · z∈[+1074,+1220]mm · 5 of 114 slices shown, 7 images]
[im 19/114  soft-tissue]
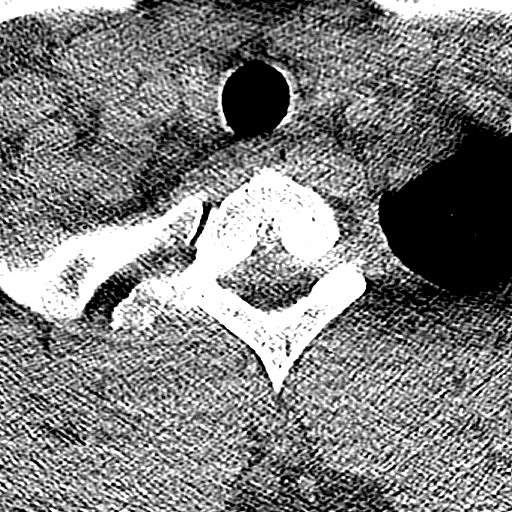
[im 19/114  bone]
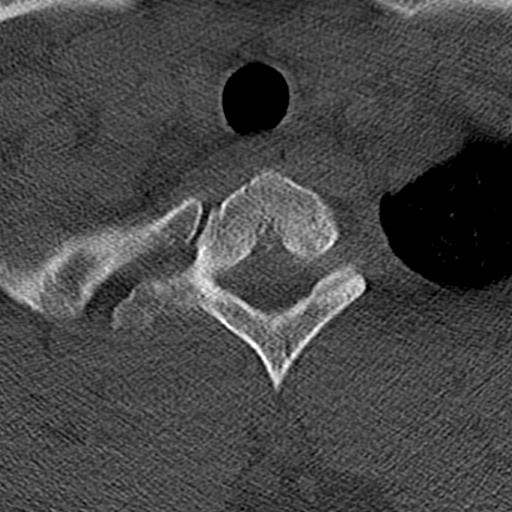
[im 38/114  bone]
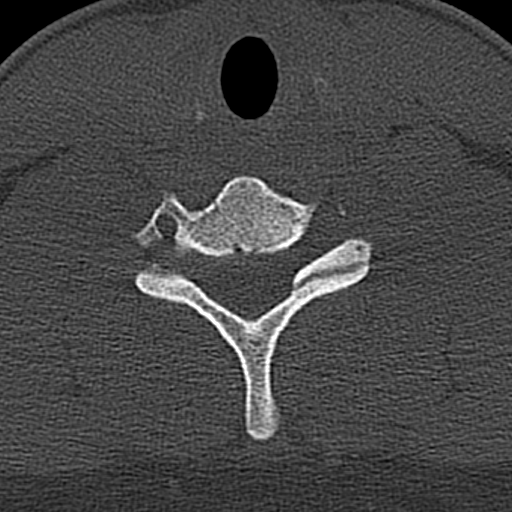
[im 57/114  bone]
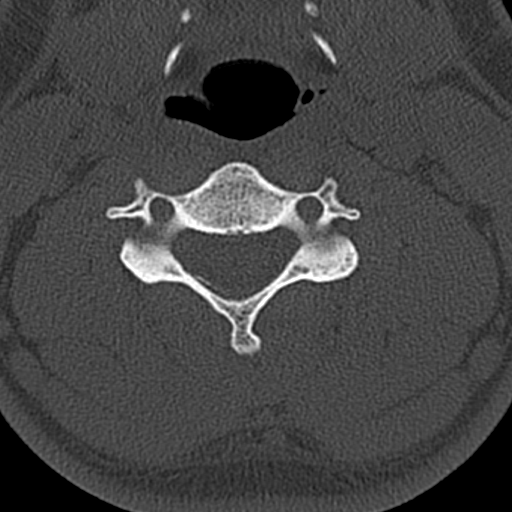
[im 76/114  bone]
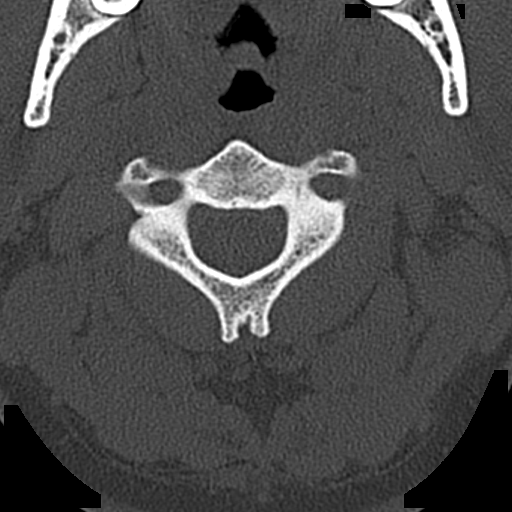
[im 95/114  soft-tissue]
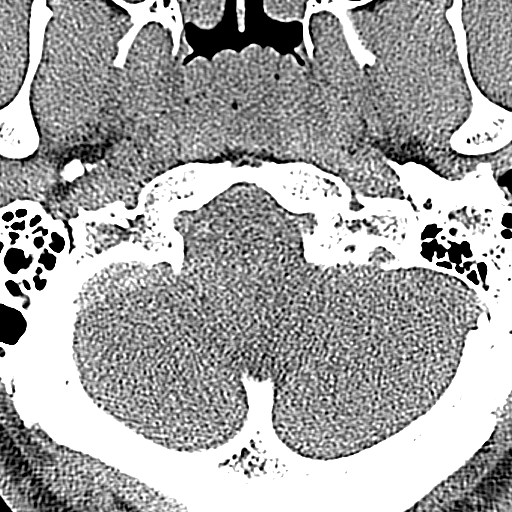
[im 95/114  bone]
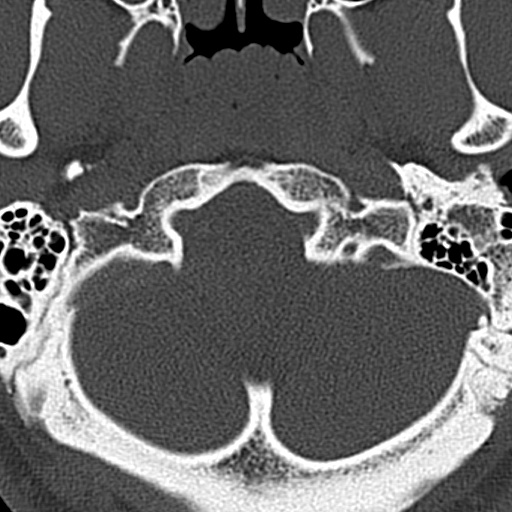

[Series 6: coronal bone · coronal · 0.31mm/px · 3 of 61 slices shown]
[im 13/61  bone]
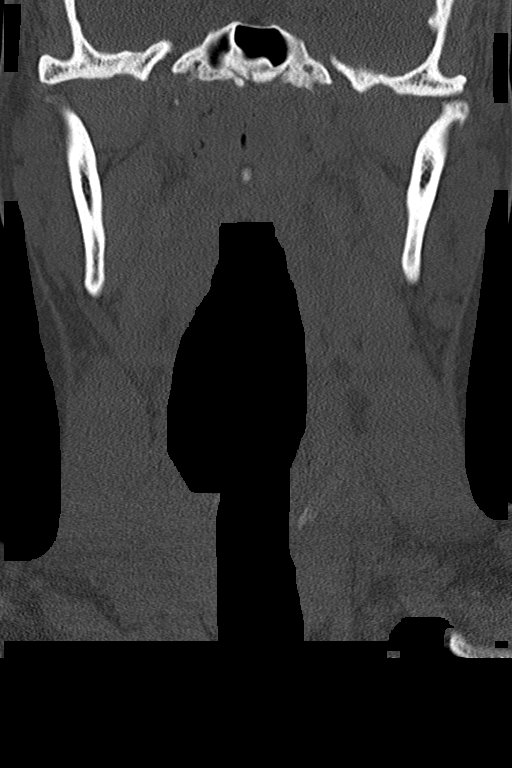
[im 25/61  bone]
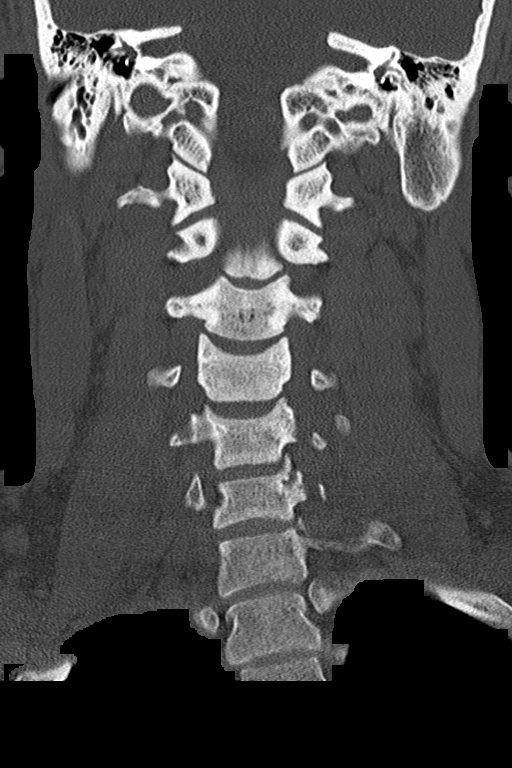
[im 37/61  bone]
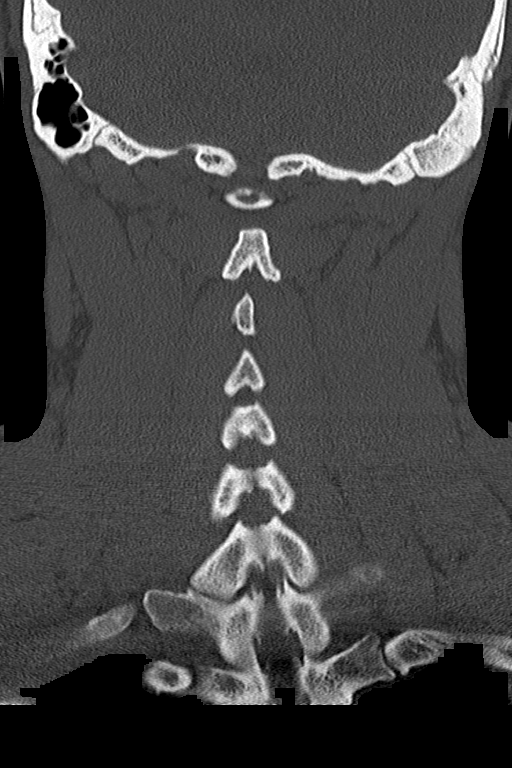

[Series 7: sagittal bone · sagittal · 0.41mm/px · 5 of 61 slices shown, 6 images]
[im 21/61  bone]
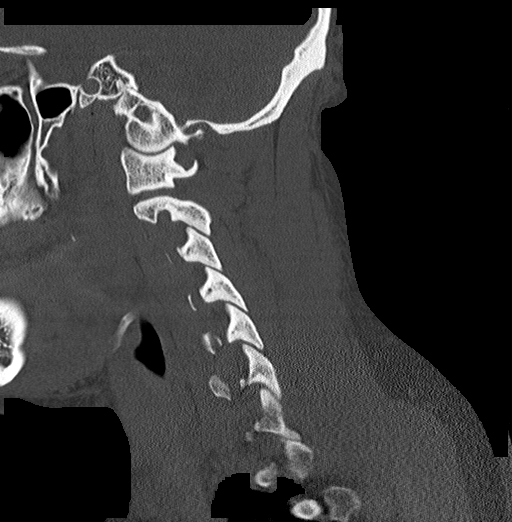
[im 26/61  bone]
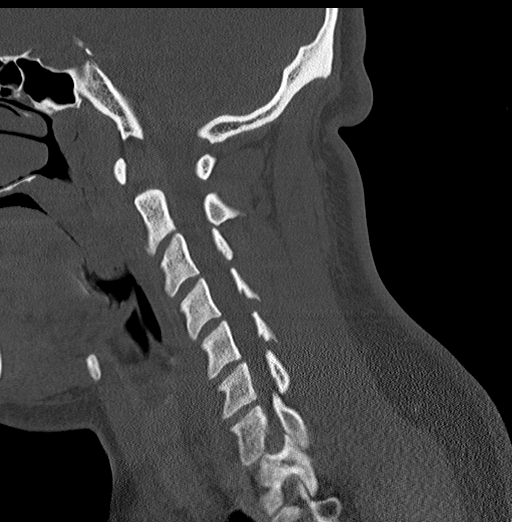
[im 31/61  soft-tissue]
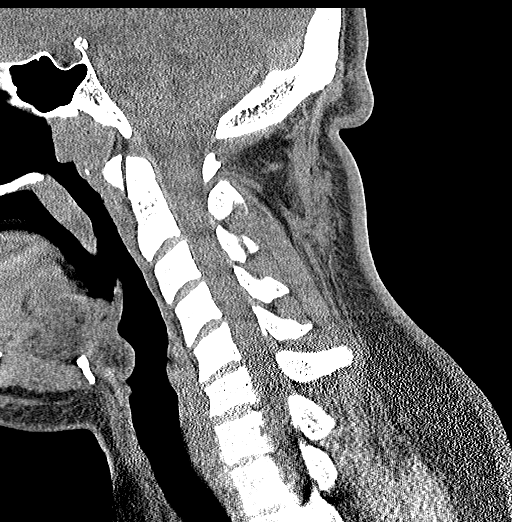
[im 31/61  bone]
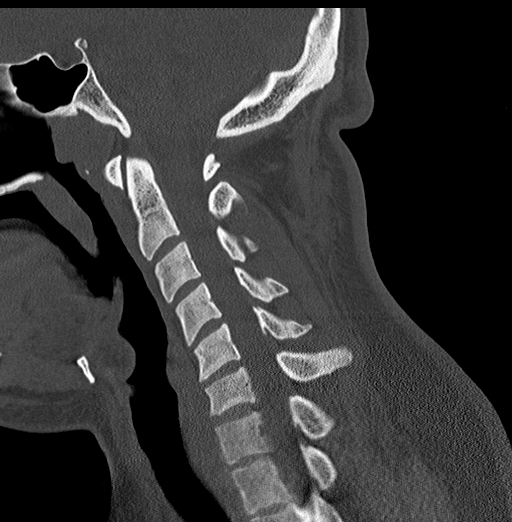
[im 36/61  bone]
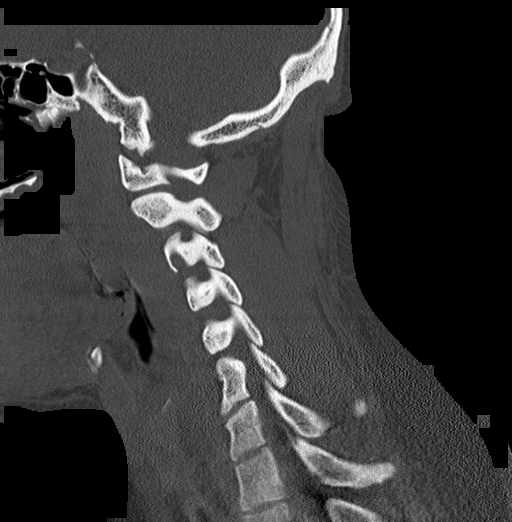
[im 41/61  bone]
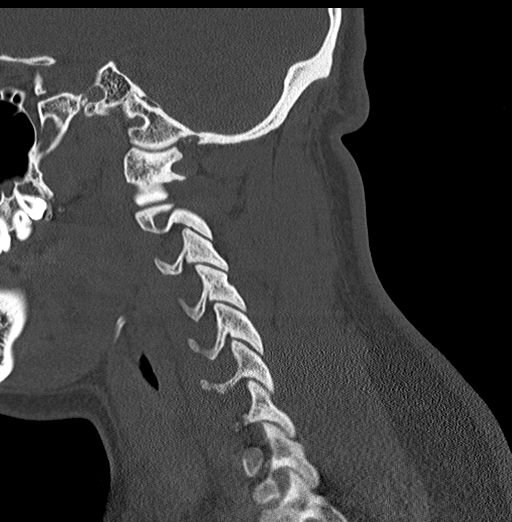

[15 of 33 positions shown; findings below may reference images not displayed]

FINDINGS: CT HEAD FINDINGS

Brain: No evidence of acute infarction, hemorrhage, hydrocephalus,
extra-axial collection or mass lesion/mass effect.

Vascular: No hyperdense vessel or unexpected calcification.

Skull: No osseous abnormality.

Sinuses/Orbits: Visualized paranasal sinuses are clear. Visualized
mastoid sinuses are clear. Visualized orbits demonstrate no focal
abnormality.

Other: No fluid collection or hematoma.

CT CERVICAL SPINE FINDINGS

Alignment: Normal.

Skull base and vertebrae: No acute fracture. No primary bone lesion
or focal pathologic process.

Soft tissues and spinal canal: No prevertebral fluid or swelling. No
visible canal hematoma.

Disc levels:  Disc spaces are maintained. No foraminal narrowing.

Upper chest: Lung apices are clear.

Other: No fluid collection or hematoma.
IMPRESSION: 1. No acute intracranial pathology.
2. No acute osseous injury the cervical spine.

## 2022-05-31 IMAGING — CT CT HEAD W/O CM
1 series · 14 of 35 positions shown, 18 images · non-contrast
Comparison: None.

CLINICAL DATA: Neck trauma, status post fall

EXAM:
CT HEAD WITHOUT CONTRAST
CT CERVICAL SPINE WITHOUT CONTRAST
TECHNIQUE: Multidetector CT imaging of the head and cervical spine was
performed following the standard protocol without intravenous
contrast. Multiplanar CT image reconstructions of the cervical spine
were also generated.

[Series 5: coronal soft tissue · coronal · 0.33mm/px · 14 of 76 slices shown, 18 images]
[im 6/76  brain]
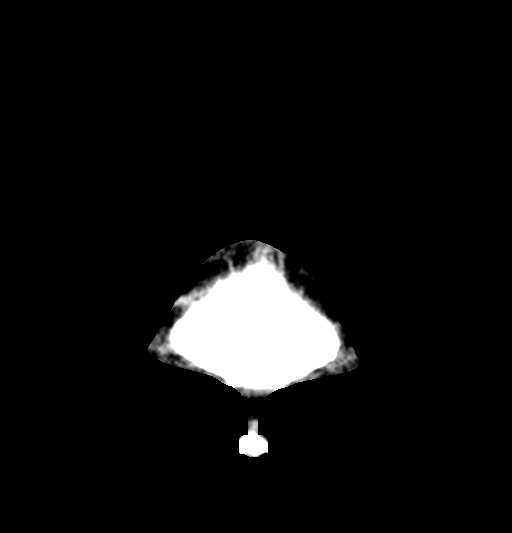
[im 6/76  bone]
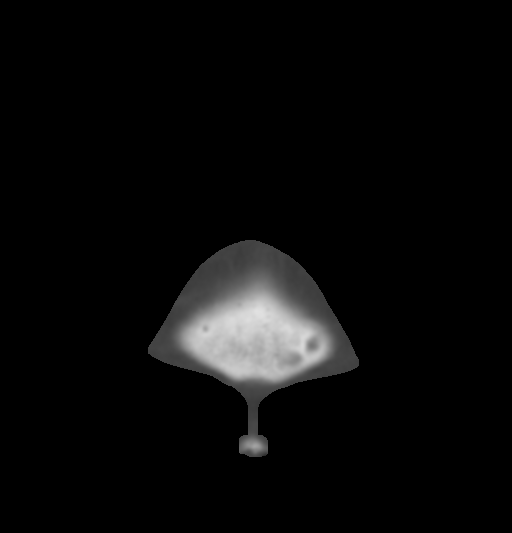
[im 9/76  brain]
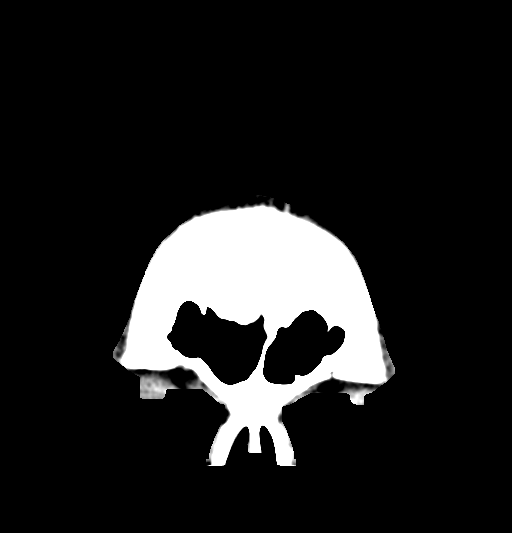
[im 13/76  brain]
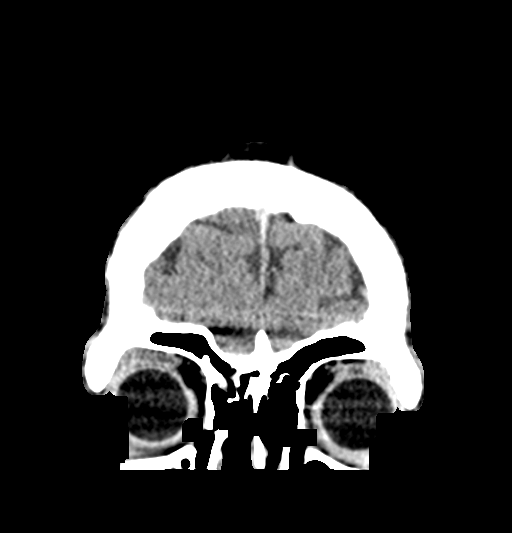
[im 19/76  brain]
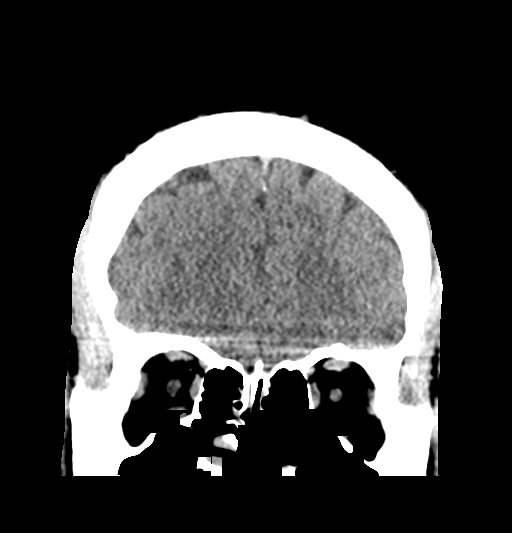
[im 24/76  brain]
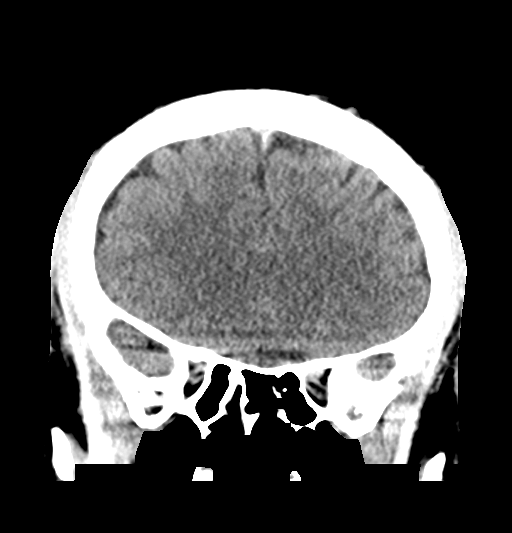
[im 24/76  bone]
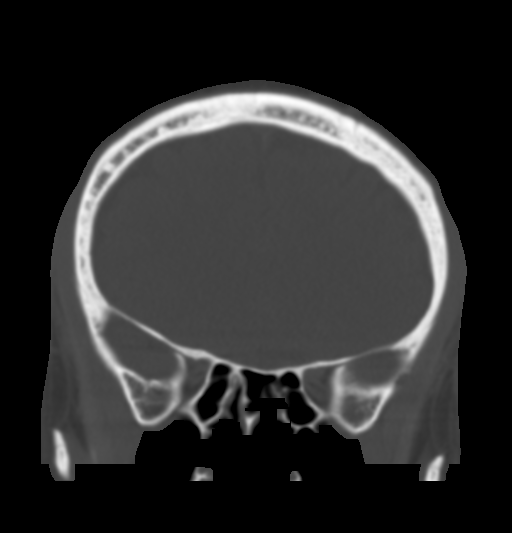
[im 29/76  brain]
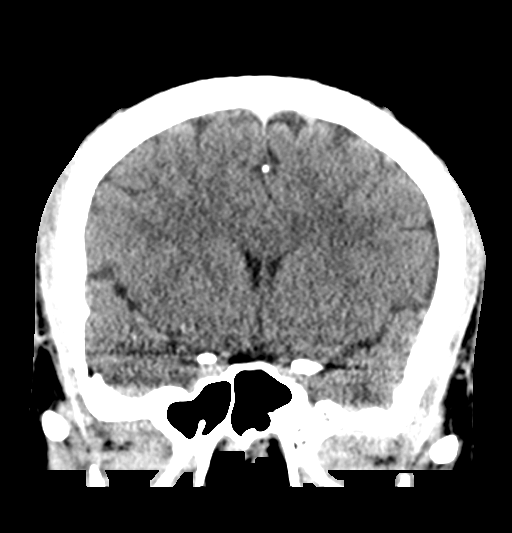
[im 37/76  brain]
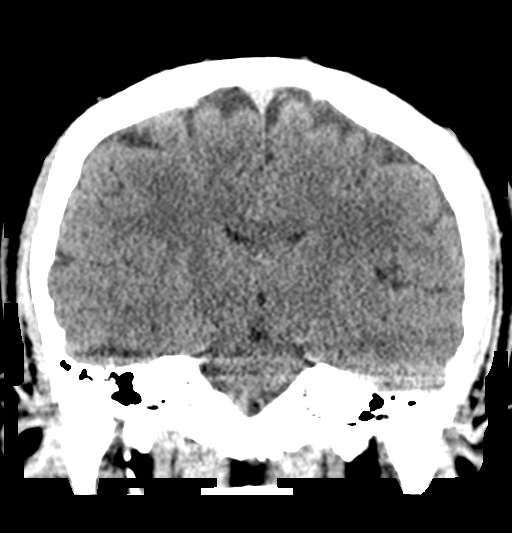
[im 42/76  brain]
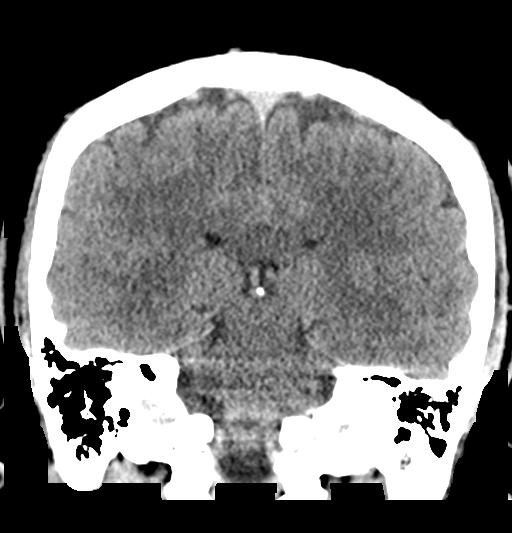
[im 50/76  brain]
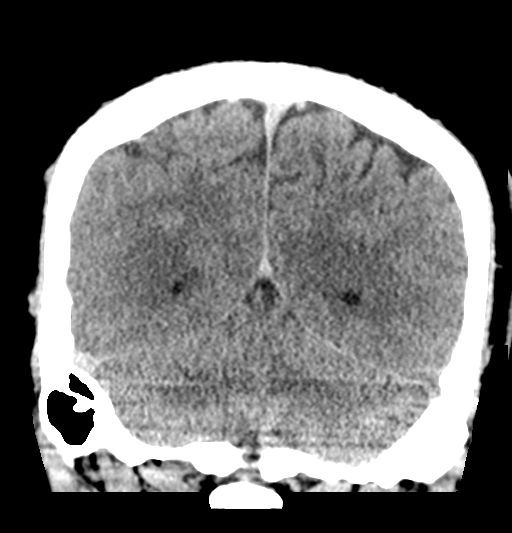
[im 50/76  bone]
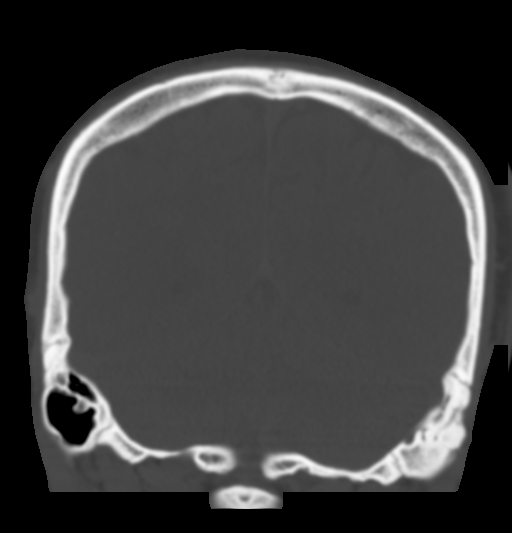
[im 52/76  brain]
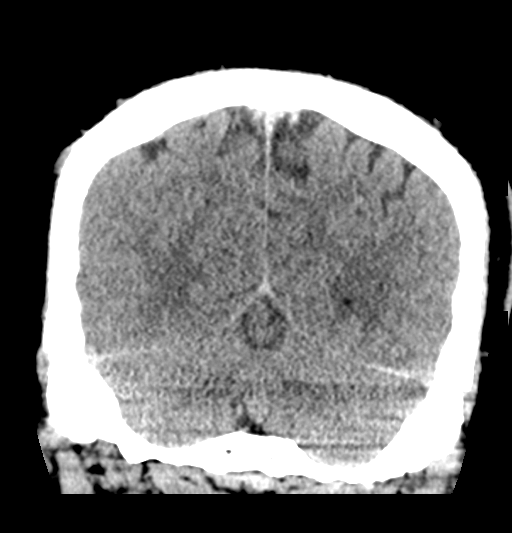
[im 57/76  brain]
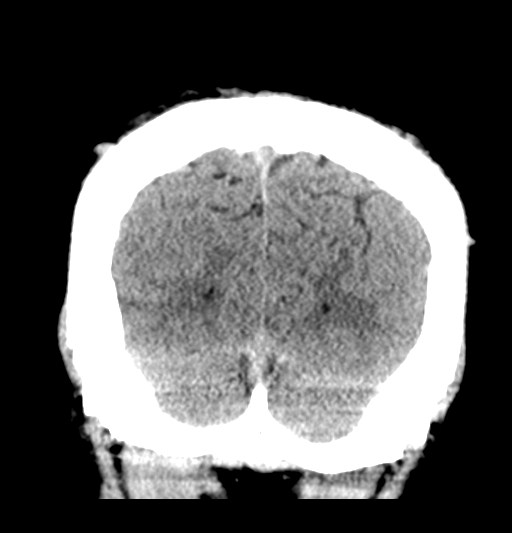
[im 63/76  brain]
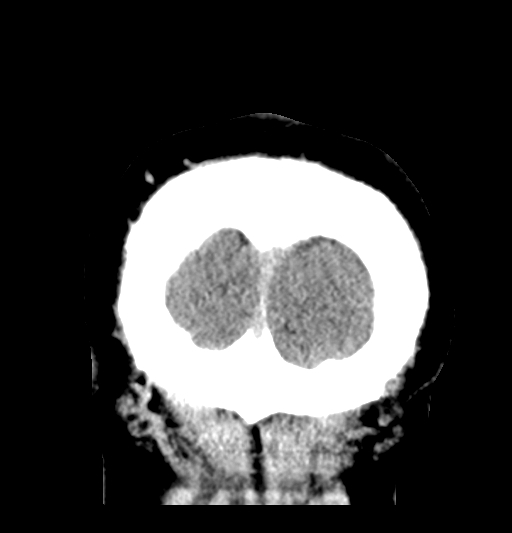
[im 67/76  brain]
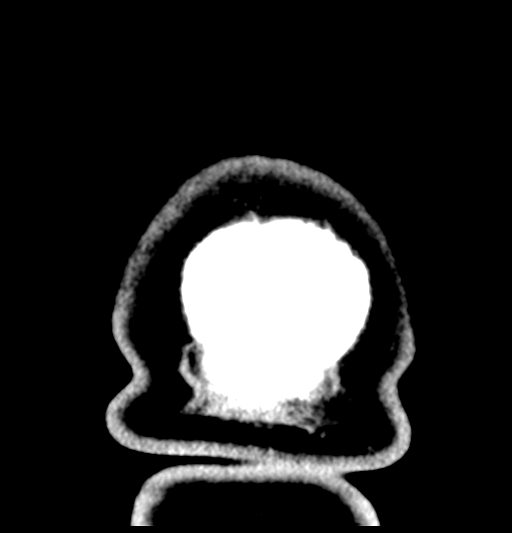
[im 67/76  bone]
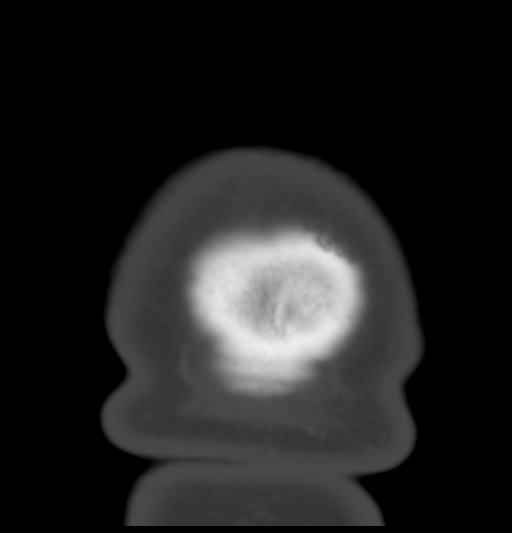
[im 70/76  brain]
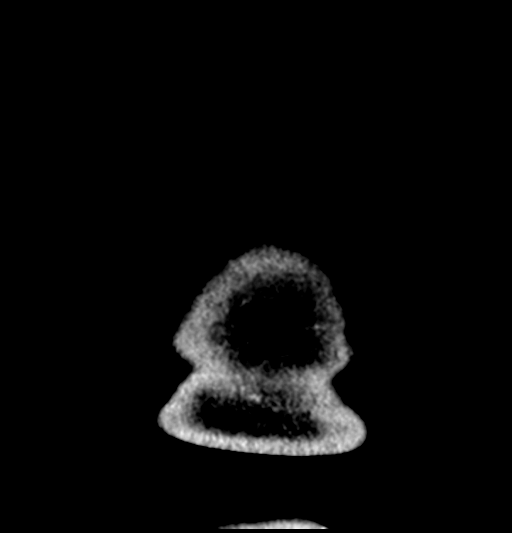

[14 of 35 positions shown; findings below may reference images not displayed]

FINDINGS: CT HEAD FINDINGS

Brain: No evidence of acute infarction, hemorrhage, hydrocephalus,
extra-axial collection or mass lesion/mass effect.

Vascular: No hyperdense vessel or unexpected calcification.

Skull: No osseous abnormality.

Sinuses/Orbits: Visualized paranasal sinuses are clear. Visualized
mastoid sinuses are clear. Visualized orbits demonstrate no focal
abnormality.

Other: No fluid collection or hematoma.

CT CERVICAL SPINE FINDINGS

Alignment: Normal.

Skull base and vertebrae: No acute fracture. No primary bone lesion
or focal pathologic process.

Soft tissues and spinal canal: No prevertebral fluid or swelling. No
visible canal hematoma.

Disc levels:  Disc spaces are maintained. No foraminal narrowing.

Upper chest: Lung apices are clear.

Other: No fluid collection or hematoma.
IMPRESSION: 1. No acute intracranial pathology.
2. No acute osseous injury the cervical spine.

## 2022-07-13 ENCOUNTER — Ambulatory Visit (HOSPITAL_COMMUNITY)
Admission: EM | Admit: 2022-07-13 | Discharge: 2022-07-13 | Disposition: A | Discharge status: Home / Self Care | Payer: 59 | Attending: Emergency Medicine | Admitting: Emergency Medicine

## 2022-07-13 DIAGNOSIS — J392 Other diseases of pharynx: Secondary | ICD-10-CM

## 2022-07-13 DIAGNOSIS — T7840XA Allergy, unspecified, initial encounter: Secondary | ICD-10-CM

## 2022-07-13 MED ORDER — METHYLPREDNISOLONE SODIUM SUCC 125 MG IJ SOLR
125.0000 mg | Freq: Once | INTRAMUSCULAR | Status: AC
  Administered 2022-07-13: 125 mg via INTRAMUSCULAR

## 2022-07-13 MED ORDER — METHYLPREDNISOLONE SODIUM SUCC 125 MG IJ SOLR
INTRAMUSCULAR | Status: AC
  Filled 2022-07-13: qty 2

## 2022-07-13 MED ORDER — EPINEPHRINE 0.3 MG/0.3ML IJ SOAJ: 0.3000 mg | INTRAMUSCULAR | 0 refills | Status: AC | PRN

## 2022-07-13 MED ORDER — PREDNISONE 20 MG PO TABS: 40.0000 mg | ORAL_TABLET | Freq: Every day | ORAL | 0 refills | Status: AC

## 2022-07-13 NOTE — ED Triage Notes (Signed)
Pt reports he was eating some food that contain garlic. 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 He reports his throat feels itchy.

## 2022-07-13 NOTE — Discharge Instructions (Addendum)
Today you are being treated for the poison ivy/oak rash  You have been given an injection of steroids today in the office today to help reduce the inflammatory process that occurs with this rash which will help minimize your itching as well as begin to clear  Starting tomorrow take prednisone every morning with food as directed, to continue the above process  You may Benadryl, Claritin or Zyrtec for additional management to help calm itching  You have been ordered an EpiPen, instructions are given with the plan on how to administer, at any point if you begin to have difficulty breathing you may administer the EpiPen and then go to the nearest emergency department for further evaluation and management

## 2022-07-13 NOTE — ED Provider Notes (Signed)
[facility]    CSN: [id number] Arrival date & time: 07/13/22  1305      History   Chief Complaint Chief Complaint  Patient presents with   Allergic Reaction    HPI Edgar Crane is a 21 y.o. male.   Patient presents with an itchy throat and sensation of tightness beginning within the last hour after accidentally eating garlic while at work.  Known anaphylactic reaction.  Not attempted treatment.  History of asthma.  Denies rash, difficulty swallowing, shortness of breath, wheezing or cough.   Past Medical History:  Diagnosis Date   Asthma     Patient Active Problem List   Diagnosis Date Noted   Morbid obesity (HCC) 01/07/2015   Acanthosis nigricans, acquired 01/07/2015   Dyspepsia 01/07/2015   Essential hypertension, benign 01/07/2015   Goiter 01/07/2015   Gynecomastia, male 01/07/2015    No past surgical history on file.     Home Medications    Prior to Admission medications   Medication Sig Start Date End Date Taking? Authorizing Provider  EPINEPHrine 0.3 mg/0.3 mL IJ SOAJ injection Inject 0.3 mg into the muscle as needed for anaphylaxis. 07/13/22  Yes Marrion Finan R, NP  predniSONE (DELTASONE) 20 MG tablet Take 2 tablets (40 mg total) by mouth daily. 07/13/22  Yes Enedelia Martorelli, Hansel Starling R, NP  atomoxetine (STRATTERA) 25 MG capsule Take 25 mg by mouth daily.      [provider]  ibuprofen (ADVIL,MOTRIN) 600 MG tablet Take 1 tablet (600 mg total) by mouth every 8 (eight) hours as needed. 03/31/18   Mardella Layman, MD  methylphenidate 36 MG PO CR tablet Take 36 mg by mouth daily.    [provider]    Family History Family History  Problem Relation Age of Onset   Diabetes Mother    Hyperlipidemia Mother    Stroke Maternal Grandfather     Social History Social History   Tobacco Use   Smoking status: Some Days    Types: Cigarettes   Smokeless tobacco: Current     Allergies   Patient has no known allergies.   Review of  Systems Review of Systems Defer to HPI   Physical Exam Triage Vital Signs ED Triage Vitals [07/13/22 1309]  Enc Vitals Group     BP (!) 148/78     Pulse Rate 75     Resp 18     Temp 98.2 F (36.8 C)     Temp Source Oral     SpO2 100 %     Weight      Height      Head Circumference      Peak Flow      Pain Score      Pain Loc      Pain Edu?      Excl. in GC?    No data found.  Updated Vital Signs BP (!) 148/78 (BP Location: Left Arm)   Pulse 75   Temp 98.2 F (36.8 C) (Oral)   Resp 18   SpO2 100%   Visual Acuity Right Eye Distance:   Left Eye Distance:   Bilateral Distance:    Right Eye Near:   Left Eye Near:    Bilateral Near:     Physical Exam Constitutional:      Appearance: Normal appearance.  HENT:     Head: Normocephalic.     Mouth/Throat:     Mouth: Mucous membranes are moist.     Pharynx: Oropharynx is clear.  No posterior oropharyngeal erythema.  Eyes:     Extraocular Movements: Extraocular movements intact.  Cardiovascular:     Pulses: Normal pulses.     Heart sounds: Normal heart sounds.  Pulmonary:     Effort: Pulmonary effort is normal.     Breath sounds: Normal breath sounds.  Neurological:     Mental Status: He is alert and oriented to person, place, and time. Mental status is at baseline.  Psychiatric:        Mood and Affect: Mood normal.      UC Treatments / Results  Labs (all labs ordered are listed, but only abnormal results are displayed) Labs Reviewed - No data to display  EKG   Radiology No results found.  Procedures Procedures (including critical care time)  Medications Ordered in UC Medications  methylPREDNISolone sodium succinate (SOLU-MEDROL) 125 mg/2 mL injection 125 mg (125 mg Intramuscular Given 07/13/22 1329)    Initial Impression / Assessment and Plan / UC Course  I have reviewed the triage vital signs and the nursing notes.  Pertinent labs & imaging results that were available during my care of the  patient were reviewed by me and considered in my medical decision making (see chart for details).  Throat irritation, allergic reaction, initial encounter  Vital signs are stable patient is in no signs of distress, O2 saturation 100% on room air and lungs are clear to auscultation, pharynx is clear without signs of closing or obstruction, discussed with patient, methylprednisolone injection given in office and on reevaluation after 10 to 15 minutes patient endorses symptoms have begun to subside, prednisone 40 mg burst prescribed in efforts to prevent delaying reaction as well as EpiPen due to history of anaphylactic reaction, discussed at any point if he begins to have difficulty breathing he is to administer his EpiPen and then go to the nearest emergency department for further management Final Clinical Impressions(s) / UC Diagnoses   Final diagnoses:  Allergic reaction, initial encounter  Throat irritation     Discharge Instructions      Today you are being treated for the poison ivy/oak rash  You have been given an injection of steroids today in the office today to help reduce the inflammatory process that occurs with this rash which will help minimize your itching as well as begin to clear  Starting tomorrow take prednisone every morning with food as directed, to continue the above process  You may Benadryl, Claritin or Zyrtec for additional management to help calm itching  You have been ordered an EpiPen, instructions are given with the plan on how to administer, at any point if you begin to have difficulty breathing you may administer the EpiPen and then go to the nearest emergency department for further evaluation and management      ED Prescriptions     Medication Sig Dispense Auth. Provider   predniSONE (DELTASONE) 20 MG tablet Take 2 tablets (40 mg total) by mouth daily. 10 tablet Rey Dansby R, NP   EPINEPHrine 0.3 mg/0.3 mL IJ SOAJ injection Inject 0.3 mg into the  muscle as needed for anaphylaxis. 1 each Valinda Hoar, NP      PDMP not reviewed this encounter.   Valinda Hoar, NP 07/13/22 1352

## 2022-08-15 ENCOUNTER — Emergency Department (HOSPITAL_COMMUNITY)
Admission: EM | Admit: 2022-08-15 | Discharge: 2022-08-16 | Disposition: A | Discharge status: Home / Self Care | Payer: 59 | Attending: Emergency Medicine | Admitting: Emergency Medicine

## 2022-08-15 ENCOUNTER — Encounter (HOSPITAL_COMMUNITY): Payer: Self-pay

## 2022-08-15 ENCOUNTER — Other Ambulatory Visit: Payer: Self-pay

## 2022-08-15 DIAGNOSIS — J45909 Unspecified asthma, uncomplicated: Secondary | ICD-10-CM | POA: Insufficient documentation

## 2022-08-15 DIAGNOSIS — R1013 Epigastric pain: Secondary | ICD-10-CM | POA: Insufficient documentation

## 2022-08-15 NOTE — ED Triage Notes (Addendum)
Pt reports with abdominal pain and constipation off and on for 1 month. Pt states that he vomited before he came.

## 2022-08-16 ENCOUNTER — Emergency Department (HOSPITAL_COMMUNITY): Payer: 59

## 2022-08-16 LAB — CBC WITH DIFFERENTIAL/PLATELET
Abs Immature Granulocytes: 0.09 10*3/uL — ABNORMAL HIGH (ref 0.00–0.07)
Basophils Absolute: 0.1 10*3/uL (ref 0.0–0.1)
Basophils Relative: 1 %
Eosinophils Absolute: 0 10*3/uL (ref 0.0–0.5)
Eosinophils Relative: 1 %
HCT: 43.5 % (ref 39.0–52.0)
Hemoglobin: 15.5 g/dL (ref 13.0–17.0)
Immature Granulocytes: 1 %
Lymphocytes Relative: 31 %
Lymphs Abs: 2.5 10*3/uL (ref 0.7–4.0)
MCH: 34.1 pg — ABNORMAL HIGH (ref 26.0–34.0)
MCHC: 35.6 g/dL (ref 30.0–36.0)
MCV: 95.6 fL (ref 80.0–100.0)
Monocytes Absolute: 0.6 10*3/uL (ref 0.1–1.0)
Monocytes Relative: 7 %
Neutro Abs: 4.8 10*3/uL (ref 1.7–7.7)
Neutrophils Relative %: 59 %
Platelets: 210 10*3/uL (ref 150–400)
RBC: 4.55 MIL/uL (ref 4.22–5.81)
RDW: 12.5 % (ref 11.5–15.5)
WBC: 8.1 10*3/uL (ref 4.0–10.5)
nRBC: 0 % (ref 0.0–0.2)

## 2022-08-16 LAB — COMPREHENSIVE METABOLIC PANEL
ALT: 20 U/L (ref 0–44)
AST: 18 U/L (ref 15–41)
Albumin: 4 g/dL (ref 3.5–5.0)
Alkaline Phosphatase: 43 U/L (ref 38–126)
Anion gap: 4 — ABNORMAL LOW (ref 5–15)
BUN: 11 mg/dL (ref 6–20)
CO2: 28 mmol/L (ref 22–32)
Calcium: 9.2 mg/dL (ref 8.9–10.3)
Chloride: 107 mmol/L (ref 98–111)
Creatinine, Ser: 1.01 mg/dL (ref 0.61–1.24)
GFR, Estimated: >60 mL/min (ref >60)
Glucose, Bld: 91 mg/dL (ref 70–99)
Potassium: 3.8 mmol/L (ref 3.5–5.1)
Sodium: 139 mmol/L (ref 135–145)
Total Bilirubin: 2.2 mg/dL — ABNORMAL HIGH (ref 0.3–1.2)
Total Protein: 7 g/dL (ref 6.5–8.1)

## 2022-08-16 LAB — URINALYSIS, ROUTINE W REFLEX MICROSCOPIC
Bacteria, UA: NONE SEEN
Bilirubin Urine: NEGATIVE
Glucose, UA: NEGATIVE mg/dL
Hgb urine dipstick: NEGATIVE
Ketones, ur: 20 mg/dL — AB
Leukocytes,Ua: NEGATIVE
Nitrite: NEGATIVE
Protein, ur: 30 mg/dL — AB
Specific Gravity, Urine: 1.036 — ABNORMAL HIGH (ref 1.005–1.030)
pH: 5 (ref 5.0–8.0)

## 2022-08-16 LAB — LIPASE, BLOOD: Lipase: 22 U/L (ref 11–51)

## 2022-08-16 MED ORDER — ONDANSETRON 4 MG PO TBDP
4.0000 mg | ORAL_TABLET | Freq: Once | ORAL | Status: AC
  Administered 2022-08-16: 4 mg via ORAL
  Filled 2022-08-16: qty 1

## 2022-08-16 MED ORDER — DICYCLOMINE HCL 20 MG PO TABS: 20.0000 mg | ORAL_TABLET | Freq: Two times a day (BID) | ORAL | 0 refills | Status: AC

## 2022-08-16 MED ORDER — OMEPRAZOLE 20 MG PO CPDR: 20.0000 mg | DELAYED_RELEASE_CAPSULE | Freq: Every day | ORAL | 0 refills | Status: AC

## 2022-08-16 MED ORDER — DICYCLOMINE HCL 10 MG PO CAPS
10.0000 mg | ORAL_CAPSULE | Freq: Once | ORAL | Status: AC
  Administered 2022-08-16: 10 mg via ORAL
  Filled 2022-08-16: qty 1

## 2022-08-16 NOTE — ED Provider Notes (Signed)
[facility]-EMERGENCY DEPT Provider Note   CSN: [id number] Arrival date & time: 08/15/22  2059     History  Chief Complaint  Patient presents with   Abdominal Pain   Constipation    Edgar Crane is a 21 y.o. male.  HPI     This is a 21 year old male who presents with abdominal pain.  Patient reports several month history of waxing and waning abdominal discomfort.  He states that he had an episode of emesis tonight which brought him in.  He has had epigastric discomfort that sometimes radiates into his chest.  He reports early satiety.  Denies any association of pain with eating.  He states he often times has a hard time having normal bowel movements.  Denies fevers.  Denies urinary symptoms.  Has never had a GI work-up or been diagnosed with GI illness such as IBS.  Home Medications Prior to Admission medications   Medication Sig Start Date End Date Taking? Authorizing Provider  dicyclomine (BENTYL) 20 MG tablet Take 1 tablet (20 mg total) by mouth 2 (two) times daily. 08/16/22  Yes Nicholos Aloisi, Mayer Masker, MD  omeprazole (PRILOSEC) 20 MG capsule Take 1 capsule (20 mg total) by mouth daily. 08/16/22  Yes Elyan Vanwieren, Mayer Masker, MD  atomoxetine (STRATTERA) 25 MG capsule Take 25 mg by mouth daily.      [provider]  EPINEPHrine 0.3 mg/0.3 mL IJ SOAJ injection Inject 0.3 mg into the muscle as needed for anaphylaxis. 07/13/22   White, Elita Boone, NP  ibuprofen (ADVIL,MOTRIN) 600 MG tablet Take 1 tablet (600 mg total) by mouth every 8 (eight) hours as needed. 03/31/18   Mardella Layman, MD  methylphenidate 36 MG PO CR tablet Take 36 mg by mouth daily.    [provider]  predniSONE (DELTASONE) 20 MG tablet Take 2 tablets (40 mg total) by mouth daily. 07/13/22   Valinda Hoar, NP      Allergies    Patient has no known allergies.    Review of Systems   Review of Systems  Constitutional:  Negative for fever.  Gastrointestinal:  Positive for abdominal  pain and constipation.  All other systems reviewed and are negative.   Physical Exam Updated Vital Signs BP 129/66   Pulse (!) 51   Temp 98.7 F (37.1 C)   Resp 16   Ht 1.88 m (6\' 2" )   Wt 84.4 kg   SpO2 97%   BMI 23.88 kg/m  Physical Exam Vitals and nursing note reviewed.  Constitutional:      Appearance: He is well-developed. He is not ill-appearing.  HENT:     Head: Normocephalic and atraumatic.  Eyes:     Pupils: Pupils are equal, round, and reactive to light.  Cardiovascular:     Rate and Rhythm: Normal rate and regular rhythm.     Heart sounds: Normal heart sounds. No murmur heard. Pulmonary:     Effort: Pulmonary effort is normal. No respiratory distress.     Breath sounds: Normal breath sounds. No wheezing.  Abdominal:     General: Bowel sounds are normal.     Palpations: Abdomen is soft.     Tenderness: There is no abdominal tenderness. There is no rebound.  Musculoskeletal:     Cervical back: Neck supple.  Lymphadenopathy:     Cervical: No cervical adenopathy.  Skin:    General: Skin is warm and dry.  Neurological:     Mental Status: He is alert and oriented to  person, place, and time.  Psychiatric:        Mood and Affect: Mood normal.     ED Results / Procedures / Treatments   Labs (all labs ordered are listed, but only abnormal results are displayed) Labs Reviewed  CBC WITH DIFFERENTIAL/PLATELET - Abnormal; Notable for the following components:      Result Value   MCH 34.1 (*)    Abs Immature Granulocytes 0.09 (*)    All other components within normal limits  COMPREHENSIVE METABOLIC PANEL - Abnormal; Notable for the following components:   Total Bilirubin 2.2 (*)    Anion gap 4 (*)    All other components within normal limits  URINALYSIS, ROUTINE W REFLEX MICROSCOPIC - Abnormal; Notable for the following components:   Specific Gravity, Urine 1.036 (*)    Ketones, ur 20 (*)    Protein, ur 30 (*)    All other components within normal limits   LIPASE, BLOOD    EKG None  Radiology DG Abdomen Acute W/Chest  Result Date: 08/16/2022 CLINICAL DATA:  Constipation and abdominal pain. EXAM: DG ABDOMEN ACUTE WITH 1 VIEW CHEST COMPARISON:  December 04, 2021 FINDINGS: There is no evidence of dilated bowel loops or free intraperitoneal air. A moderate amount of stool is seen throughout the transverse and descending colon. No radiopaque calculi or other significant radiographic abnormality is seen. Heart size and mediastinal contours are within normal limits. Both lungs are clear. IMPRESSION: Negative abdominal radiographs.  No acute cardiopulmonary disease. Electronically Signed   By: Aram Candela M.D.   On: 08/16/2022 00:32    Procedures Procedures    Medications Ordered in ED Medications  ondansetron (ZOFRAN-ODT) disintegrating tablet 4 mg (4 mg Oral Given 08/16/22 0016)  dicyclomine (BENTYL) capsule 10 mg (10 mg Oral Given 08/16/22 0035)    ED Course/ Medical Decision Making/ A&P                           Medical Decision Making Amount and/or Complexity of Data Reviewed Labs: ordered. Radiology: ordered.  Risk Prescription drug management.   This patient presents to the ED for concern of abdominal pain, constipation, this involves an extensive number of treatment options, and is a complaint that carries with it a high risk of complications and morbidity.  I considered the following differential and admission for this acute, potentially life threatening condition.  The differential diagnosis includes pancreatitis, cholecystitis, appendicitis, obstruction, IBS, GI illness  MDM:    This is a 21 year old male who presents with waxing and waning abdominal discomfort and constipation for at least 1 month.  He is nontoxic and vital signs are reassuring.  He did have 1 episode of emesis prior to arrival.  His abdomen is nontender and benign.  Given duration of symptoms, doubt acute emergent process such as appendicitis or  cholecystitis especially given benign abdomen.  Labs obtained and reviewed.  No leukocytosis.  No metabolic derangements.  LFTs and lipase are normal.  X-rays do not show significant stool burden or obstructive pattern.  Patient significantly improved with PPI and Bentyl.  We will trial a PPI and Bentyl at home.  We will give gastroenterology follow-up.  (Labs, imaging, consults)  Labs: I Ordered, and personally interpreted labs.  The pertinent results include: CBC, CMP, lipase  Imaging Studies ordered: I ordered imaging studies including acute abdominal series I independently visualized and interpreted imaging. I agree with the radiologist interpretation  Additional history obtained from chart  review.  External records from outside source obtained and reviewed including prior evaluations  Cardiac Monitoring: The patient was maintained on a cardiac monitor.  I personally viewed and interpreted the cardiac monitored which showed an underlying rhythm of: Sinus rhythm  Reevaluation: After the interventions noted above, I reevaluated the patient and found that they have :improved  Social Determinants of Health: Lives independently  Disposition: Discharge  Co morbidities that complicate the patient evaluation  Past Medical History:  Diagnosis Date   Asthma      Medicines Meds ordered this encounter  Medications   ondansetron (ZOFRAN-ODT) disintegrating tablet 4 mg   dicyclomine (BENTYL) capsule 10 mg   dicyclomine (BENTYL) 20 MG tablet    Sig: Take 1 tablet (20 mg total) by mouth 2 (two) times daily.    Dispense:  20 tablet    Refill:  0   omeprazole (PRILOSEC) 20 MG capsule    Sig: Take 1 capsule (20 mg total) by mouth daily.    Dispense:  30 capsule    Refill:  0    I have reviewed the patients home medicines and have made adjustments as needed  Problem List / ED Course: Problem List Items Addressed This Visit   None Visit Diagnoses     Epigastric pain    -  Primary                    Final Clinical Impression(s) / ED Diagnoses Final diagnoses:  Epigastric pain    Rx / DC Orders ED Discharge Orders          Ordered    dicyclomine (BENTYL) 20 MG tablet  2 times daily        08/16/22 0153    omeprazole (PRILOSEC) 20 MG capsule  Daily        08/16/22 0153              Merryl Hacker, MD 08/16/22 0155

## 2022-08-16 NOTE — Discharge Instructions (Signed)
You were seen today for abdominal pain.  Your work-up is reassuring.  Trial medications as prescribed.  Follow-up with gastroenterology as an outpatient for ongoing symptoms.
